# Patient Record
Sex: Female | Born: 1965 | Race: Black or African American | Hispanic: No | Smoking: Current every day smoker
Health system: Southern US, Community
[De-identification: ages and names within clinical notes are randomized; demographics above are authoritative.]

## PROBLEM LIST (undated history)

## (undated) DIAGNOSIS — J45909 Unspecified asthma, uncomplicated: Secondary | ICD-10-CM

## (undated) DIAGNOSIS — I10 Essential (primary) hypertension: Secondary | ICD-10-CM

## (undated) DIAGNOSIS — F1921 Other psychoactive substance dependence, in remission: Secondary | ICD-10-CM

## (undated) DIAGNOSIS — Z8619 Personal history of other infectious and parasitic diseases: Secondary | ICD-10-CM

## (undated) DIAGNOSIS — F32A Depression, unspecified: Secondary | ICD-10-CM

## (undated) DIAGNOSIS — F419 Anxiety disorder, unspecified: Secondary | ICD-10-CM

## (undated) DIAGNOSIS — T7840XA Allergy, unspecified, initial encounter: Secondary | ICD-10-CM

## (undated) DIAGNOSIS — R87629 Unspecified abnormal cytological findings in specimens from vagina: Secondary | ICD-10-CM

## (undated) DIAGNOSIS — D219 Benign neoplasm of connective and other soft tissue, unspecified: Secondary | ICD-10-CM

## (undated) DIAGNOSIS — G43909 Migraine, unspecified, not intractable, without status migrainosus: Secondary | ICD-10-CM

## (undated) DIAGNOSIS — E785 Hyperlipidemia, unspecified: Secondary | ICD-10-CM

## (undated) DIAGNOSIS — K219 Gastro-esophageal reflux disease without esophagitis: Secondary | ICD-10-CM

## (undated) HISTORY — DX: Depression, unspecified: F32.A

## (undated) HISTORY — DX: Migraine, unspecified, not intractable, without status migrainosus: G43.909

## (undated) HISTORY — DX: Other psychoactive substance dependence, in remission: F19.21

## (undated) HISTORY — DX: Unspecified abnormal cytological findings in specimens from vagina: R87.629

## (undated) HISTORY — DX: Gastro-esophageal reflux disease without esophagitis: K21.9

## (undated) HISTORY — DX: Personal history of other infectious and parasitic diseases: Z86.19

## (undated) HISTORY — DX: Unspecified asthma, uncomplicated: J45.909

## (undated) HISTORY — PX: APPENDECTOMY: SHX54

## (undated) HISTORY — DX: Benign neoplasm of connective and other soft tissue, unspecified: D21.9

## (undated) HISTORY — DX: Allergy, unspecified, initial encounter: T78.40XA

## (undated) HISTORY — DX: Anxiety disorder, unspecified: F41.9

## (undated) HISTORY — DX: Hyperlipidemia, unspecified: E78.5

---

## 1998-11-14 ENCOUNTER — Inpatient Hospital Stay (HOSPITAL_COMMUNITY): Admission: AD | Admit: 1998-11-14 | Discharge: 1998-11-18 | Payer: Self-pay | Admitting: *Deleted

## 1999-03-30 ENCOUNTER — Emergency Department (HOSPITAL_COMMUNITY): Admission: EM | Admit: 1999-03-30 | Discharge: 1999-03-30 | Payer: Self-pay | Admitting: Emergency Medicine

## 1999-03-30 ENCOUNTER — Encounter: Payer: Self-pay | Admitting: Emergency Medicine

## 2000-04-02 ENCOUNTER — Inpatient Hospital Stay (HOSPITAL_COMMUNITY): Admission: AD | Admit: 2000-04-02 | Discharge: 2000-04-02 | Payer: Self-pay | Admitting: *Deleted

## 2000-04-07 ENCOUNTER — Encounter (INDEPENDENT_AMBULATORY_CARE_PROVIDER_SITE_OTHER): Payer: Self-pay

## 2000-04-07 ENCOUNTER — Encounter: Payer: Self-pay | Admitting: *Deleted

## 2000-04-07 ENCOUNTER — Inpatient Hospital Stay (HOSPITAL_COMMUNITY): Admission: AD | Admit: 2000-04-07 | Discharge: 2000-04-07 | Payer: Self-pay | Admitting: *Deleted

## 2000-04-12 ENCOUNTER — Ambulatory Visit (HOSPITAL_COMMUNITY): Admission: RE | Admit: 2000-04-12 | Discharge: 2000-04-12 | Payer: Self-pay | Admitting: *Deleted

## 2000-04-12 ENCOUNTER — Encounter (INDEPENDENT_AMBULATORY_CARE_PROVIDER_SITE_OTHER): Payer: Self-pay | Admitting: Specialist

## 2000-12-06 ENCOUNTER — Encounter: Payer: Self-pay | Admitting: *Deleted

## 2000-12-06 ENCOUNTER — Ambulatory Visit (HOSPITAL_COMMUNITY): Admission: RE | Admit: 2000-12-06 | Discharge: 2000-12-06 | Payer: Self-pay | Admitting: *Deleted

## 2000-12-21 ENCOUNTER — Inpatient Hospital Stay (HOSPITAL_COMMUNITY): Admission: AD | Admit: 2000-12-21 | Discharge: 2000-12-21 | Payer: Self-pay | Admitting: *Deleted

## 2000-12-22 ENCOUNTER — Inpatient Hospital Stay (HOSPITAL_COMMUNITY): Admission: AD | Admit: 2000-12-22 | Discharge: 2000-12-25 | Payer: Self-pay | Admitting: *Deleted

## 2001-01-24 ENCOUNTER — Inpatient Hospital Stay (HOSPITAL_COMMUNITY): Admission: AD | Admit: 2001-01-24 | Discharge: 2001-01-24 | Payer: Self-pay | Admitting: *Deleted

## 2001-03-22 ENCOUNTER — Encounter: Payer: Self-pay | Admitting: *Deleted

## 2001-03-22 ENCOUNTER — Ambulatory Visit (HOSPITAL_COMMUNITY): Admission: RE | Admit: 2001-03-22 | Discharge: 2001-03-22 | Payer: Self-pay | Admitting: Obstetrics

## 2001-03-27 ENCOUNTER — Inpatient Hospital Stay (HOSPITAL_COMMUNITY): Admission: AD | Admit: 2001-03-27 | Discharge: 2001-03-30 | Payer: Self-pay | Admitting: *Deleted

## 2001-04-11 ENCOUNTER — Inpatient Hospital Stay (HOSPITAL_COMMUNITY): Admission: AD | Admit: 2001-04-11 | Discharge: 2001-04-13 | Payer: Self-pay | Admitting: *Deleted

## 2001-04-11 ENCOUNTER — Encounter (INDEPENDENT_AMBULATORY_CARE_PROVIDER_SITE_OTHER): Payer: Self-pay

## 2002-01-09 ENCOUNTER — Emergency Department (HOSPITAL_COMMUNITY): Admission: EM | Admit: 2002-01-09 | Discharge: 2002-01-09 | Payer: Self-pay | Admitting: Emergency Medicine

## 2003-01-01 ENCOUNTER — Emergency Department (HOSPITAL_COMMUNITY): Admission: EM | Admit: 2003-01-01 | Discharge: 2003-01-01 | Payer: Self-pay

## 2003-07-31 ENCOUNTER — Encounter: Payer: Self-pay | Admitting: Emergency Medicine

## 2003-07-31 ENCOUNTER — Emergency Department (HOSPITAL_COMMUNITY): Admission: EM | Admit: 2003-07-31 | Discharge: 2003-07-31 | Payer: Self-pay | Admitting: Emergency Medicine

## 2006-05-25 ENCOUNTER — Inpatient Hospital Stay (HOSPITAL_COMMUNITY): Admission: EM | Admit: 2006-05-25 | Discharge: 2006-05-29 | Payer: Self-pay | Admitting: *Deleted

## 2006-05-25 ENCOUNTER — Ambulatory Visit: Payer: Self-pay | Admitting: *Deleted

## 2007-03-05 ENCOUNTER — Emergency Department (HOSPITAL_COMMUNITY): Admission: EM | Admit: 2007-03-05 | Discharge: 2007-03-05 | Payer: Self-pay | Admitting: Emergency Medicine

## 2010-06-15 ENCOUNTER — Emergency Department (HOSPITAL_COMMUNITY): Admission: EM | Admit: 2010-06-15 | Discharge: 2010-06-15 | Payer: Self-pay | Admitting: Emergency Medicine

## 2010-06-24 ENCOUNTER — Emergency Department (HOSPITAL_COMMUNITY): Admission: EM | Admit: 2010-06-24 | Discharge: 2010-06-24 | Payer: Self-pay | Admitting: Emergency Medicine

## 2011-04-07 NOTE — Op Note (Signed)
[facility]  Patient:    Tracy Fischer, Tracy Fischer                   MRN: [id number] Proc. Date: 04/12/00 Adm. Date:  [id number] Disc. Date: [id number] Attending:  Deniece Ree                           Operative Report  PREOPERATIVE DIAGNOSIS:       Missed abortion.  POSTOPERATIVE DIAGNOSIS:      Missed abortion.  Plus pending pathology.  OPERATION:                    Dilatation and evacuation and curettage of the right uterus.  SURGEON:                      Deniece Ree, M.D.  ASSISTANT:  ANESTHESIA:                   MAC.  ESTIMATED BLOOD LOSS:         Approximately 100 cc.  COMPLICATIONS:                None.  CONDITION:                    The patient tolerated the procedure well and returned to the recovery room in satisfactory condition.  DESCRIPTION OF PROCEDURE:     The patient was taken to the operating room and prepped and draped in the usual fashion for dilatation and evacuation.  The speculum was placed in the right vagina at which time the right cervix was grasped with a Christella Hartigan tenaculum.  Paracervical block was then instituted using 1% Xylocaine solution.  The cervical os was dilated to a size 17 Pratt dilator.  A size 9 suction curet was introduced into the cervix in a routine fashion and evacuation was carried out utilizing the rotary type procedure.  After this was done, sharp curet was utilized for further evacuation of the uterus.  This was done and the  uterus was felt to be clean.  At this point, the left cervix and uterus was evaluated and again noted to be within normal limits.  At this point, the procedure was terminated.  The patient tolerated the procedure well and returned to the recovery room in satisfactory condition.  The patient is to be discharged when fully alert.  She has been instructed on the possible complications and care following this type of surgery.  She has been told to return to my office  in two weeks for follow-up evaluation or to call me prior to that time should any problems arise. DD:  04/12/00 TD:  04/15/00 Job: [id number]

## 2011-04-07 NOTE — Discharge Summary (Signed)
[facility]  Patient:    Tracy Fischer, Tracy Fischer                   MRN: [id number] Adm. Date:  [id number] Disc. Date: [id number] Attending:  Deniece Ree                           Discharge Summary  SUMMARY:                      The patient is a 45 year old gravida 8, para 1, abortus 7 status post a cesarean section with a history of a double uterus. Her estimated date of confinement at this time was April 25, 2001 making her approximately [redacted] weeks gestation.  The patient was admitted because of having premature contractions.  Patient was begun on a terbutaline pump which was carried until February 4 at which time she was started on terbutaline 5 mg p.o. q.6h.  After she had done well for approximately 24 hours patient was discharged home to be followed on a weekly basis in my office.  The patient was also instructed to call me prior to that time should any problems arise. DD:  01/30/01 TD:  01/30/01 Job: [id number]

## 2011-04-07 NOTE — Op Note (Signed)
[facility]  Patient:    Tracy Fischer, Tracy Fischer                   MRN: [id number] Proc. Date: 04/11/01 Adm. Date:  [id number] Attending:  Deniece Ree                           Operative Report  PREOPERATIVE DIAGNOSES:       Intrauterine pregnancy at approximately [redacted] weeks gestation, multiparity, desire for permanent sterilization.  OPERATION:                    Repeat cesarean section, bilateral tubal ligation using the modified Pomeroy procedure.  POSTOPERATIVE DIAGNOSES:      Viable female infant with Apgars of 9 and 9.  SURGEON:                      Deniece Ree, M.D.  ASSISTANT:                    Kathreen Cosier, M.D.  ANESTHESIA:                   Epidural by Dr. Jean Rosenthal.  PEDIATRICS:                   Dr. Alison Murray in the teaching service.  ESTIMATED BLOOD LOSS:         300 cc.  COMPLICATIONS:                None.  DRAINS:                       Foley left to straight drainage.                                Patient tolerated the procedure well and returned to the recovery room in satisfactory condition.  PROCEDURE:                    The patient was taken to the operating room, prepped and draped in the usual fashion for a repeat cesarean section.  A low Pfannenstiel incision was made following the course of the previous incision. This was carried down to the fascia at which time the fascia was entered and excised the extent of the incision.  The midline identified and rectus muscles separated.  The abdominal peritoneum was then entered in the vertical fashion using the Metzenbaum scissors.  The visceroperitoneum was then excised bilaterally following which the lower uterine segment was scored, entered in the midline, and bluntly dissected open.  With some difficulty with the use of a Mityvac extraction, the infants head was delivered.  The nasopharynx was then sucked out with a suction bulb.  Complete delivery was then carried  out without any problems.  Cord was clamped, cut, and infant turned over to the pediatricians who were in attendance.  Cord blood at that point was obtained. The placenta as well as all products of conception were then manually removed from the uterine cavity.  IV Pitocin as well as IV antibiotics were then begun.  The myometrium was then closed using #1 Chromic in a running stitch followed by closure of the visceroperitoneum and an embrocating stitch using 0 Chromic in a running stitch.  The evaluation of the uterus revealed the right uterus  to be the one that the pregnancy was in and had a normal post cesarean section postpartum size present.  The right tube was then grasped with Babcock clamp and ligated using 0 plain catgut.  The left uterus was small and very low appearing as compared to the right uterus.  However, the left tube was then grasped, knuckled up and using 0 plain catgut ligated utilizing the modified Pomeroy procedure.  Both tubal segments were then labeled and sent to pathology.  At this point hemostasis was present.  Sponge, needle count was correct x 2.  The abdominal peritoneum was then closed using 2-0 Chromic in a running stitch followed by closure of the fascia using #1 Dexon in a running stitch.  The subcuticular stitch using 4-0 Monocryl was used to close the skin.  The procedure was then terminated.  Patient tolerated the procedure well and returned to the recovery room in satisfactory condition. DD:  04/11/01 TD:  04/11/01 Job: [id number]

## 2011-04-07 NOTE — Discharge Summary (Signed)
[facility]  Patient:    Tracy Fischer, Tracy Fischer                   MRN: [id number] Adm. Date:  [id number] Disc. Date: [id number] Attending:  Deniece Ree                           Discharge Summary  SUMMARY:                      The patient is a 45 year old gravida 7, para 1, abortus 5, who is approximately [redacted] weeks gestation. The patient is being admitted for a repeat cesarean section and a bilateral tubal ligation. The patients previous delivery was by way of cesarean section. The patient has utilized terbutaline throughout the third trimester. The patients history includes that of having a double uterus, double vagina, and double cervix. The patient desires a permanent sterilization and therefore, a repeat cesarean section with bilateral tubal ligation is planned.  On May 23 the patient underwent a repeat cesarean section and a bilateral tubal ligation from which she tolerated the procedure very well without any problem. Postoperatively, the patient did very well without any complications, and was discharged on the third postoperative day.  DISCHARGE INSTRUCTIONS:       She was instructed on the possible complications and care following this type of surgery.  DISCHARGE FOLLOWUP:           She was told to return to my office in four weeks for followup evaluation or to call me prior to that time should any problems arise. DD:  04/27/01 TD:  04/28/01 Job: [id number]

## 2011-04-07 NOTE — H&P (Signed)
[facility]  Patient:    Tracy Fischer, Tracy Fischer                  MRN: [id number] Adm. Date:  04/11/01 Attending:  Deniece Ree, M.D.                         History and Physical  HISTORY:                      The patient is a 45 year old gravida 7, para 1, abortus 5 who is approximately [redacted] weeks gestation.  Patient is being admitted for repeat cesarean section and a bilateral tubal ligation.  The patients previous delivery was by way of cesarean section.  Patient has also been utilizing terbutaline during the third trimester.  Patients history is significant in that patient has a double uterus, double vagina, double cervix. This pregnancy is in the right uterus.  Patients only prenatal complication was premature labor which also occurred during the last pregnancy.  Patient has had five spontaneous miscarriages.  This patient desires permanent sterilization.  She understands the different types of contraception available, however, does not wish any further pregnancies.  PAST MEDICAL HISTORY:         Noncontributory except as stated in the present illness.  FAMILY HISTORY:               Noncontributory except as stated in the present illness.  REVIEW OF SYSTEMS:            Noncontributory except as stated in the present illness.  PHYSICAL EXAMINATION  GENERAL:                      Well-developed, well-nourished gravid female in no acute distress.  HEENT:                        Within normal limits.  NECK:                         Supple.  BREASTS:                      Without masses, tenderness, or discharge.  LUNGS:                        Clear to percussion, auscultation.  HEART:                        Normal sinus rhythm without murmur, rub, or gallop.  ABDOMEN:                      Approximately [redacted] weeks gestational size with good fetal heart tones in the left lower quadrant.  EXTREMITIES:                  Within normal  limits.  NEUROLOGIC:                   Within normal limits.  PELVIC:                       External genitalia, BUS:  Within normal limits. The vagina on the right is normal appearing.  Cervix is closed; however, the lower uterine segment is developing.  The uterus appears to be approximately [redacted] weeks  gestational size.  The vagina on the left is normal appearing with a normal appearing cervix.  The uterus is small and normal size.  The uteri of the left and right are totally separated and it is already known from previous cesarean section that patient only has a tube and ovary on the left and one tube and ovary on the right.  DIAGNOSES:                    Intrauterine pregnancy approximately [redacted] weeks gestation, multiparity, desires permanent sterilization.  PLAN:                         Repeat cesarean section and a bilateral tubal ligation. DD:  04/11/01 TD:  04/11/01 Job: [id number]

## 2011-04-07 NOTE — H&P (Signed)
[facility]  Patient:    Tracy Fischer, Tracy Fischer                   MRN: [id number] Adm. Date:  [id number] Disc. Date: [id number] Attending:  Deniece Ree                         History and Physical  HISTORY:  The patient is a 45 year old female who has a missed abortion.  The patient was seen on Apr 07, 2000 because of vaginal bleeding and some pelvic cramping.  On evaluation, it was noted that the patient probably had a spontaneous abortion, possible debris present.  On evaluation at that time, it was felt that the patient probably had a complete spontaneous abortion.  The patient does have two uteruses and this was felt to be in the right uterus.  This was verified by the ultrasound.  The patient did fine until Apr 11, 2000.  In the evening, the patient began passing what appeared to be products of conception, having a moderate amount of cramping, and was reevaluated and felt that she had had a missed abortion, therefore would be scheduled for dilatation and evaluation.  This information was given to the patient, who understood.  She also understood the possible complications from dilatation and evaluation.  PAST MEDICAL HISTORY:  The patient has had a previous delivery via C-section in her right uterus.  REVIEW OF SYSTEMS:  Noncontributory except as stated in the present illness.  PHYSICAL EXAMINATION:  A well-developed, well-nourished female in no acute distress.  HEENT:  Within normal limits.  NECK:  Supple.  BREASTS:  Without masses, tenderness, or discharge.  LUNGS:  Clear to percussion and auscultation.  HEART:  Normal sinus rhythm without murmurs, rubs, or gallops.  ABDOMEN:  Benign.  EXTREMITIES:  Within normal limits.  NEUROLOGIC:  Within normal limits.  PELVIC:  Examination revealed external genitalia ______ to be within normal limits. The right cervix showed products of conception protruding through the cervical s. The  uterus on the right appeared to be approximately 10 weeks in size.  The adnexa was benign.  The right cervix and vagina were all clear with normal size and shape. The uterus was normal size, shape, and consistency.  No adnexal problems.  IMPRESSION:  The diagnosis at this time is missed abortion of the right uterus.   PLAN:  The plan is for dilatation and evaluation. DD:  04/12/00 TD:  04/15/00 Job: [id number]
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
## 2011-04-07 NOTE — Discharge Summary (Signed)
[facility]  Patient:    Tracy Fischer, Tracy Fischer                   MRN: [id number] Adm. Date:  [id number] Disc. Date: [id number] Attending:  Deniece Ree Dictator:   Kathreen Cosier, M.D.                           Discharge Summary  SUMMARY:                      The patient is a 45 year old, gravida 7, para 1-0-5-1 at [redacted] weeks gestation admitted in premature labor. It was reported in the chart that she was having contractions that every three to four minutes, cervix 2-3 cm, 90%. She was started on magnesium sulfate and magnesium sulfate was discontinued on May 10. She was observed until May 11 and she had no more contractions and was discharged home by Dr. Wiliam Ke.  DISCHARGE DIAGNOSIS:          Intrauterine pregnancy at 36 weeks with premature contractions which ceased with magnesium sulfate. DD:  07/04/01 TD:  07/04/01 Job: [id number]

## 2011-12-28 ENCOUNTER — Encounter (INDEPENDENT_AMBULATORY_CARE_PROVIDER_SITE_OTHER): Payer: Self-pay

## 2015-06-13 ENCOUNTER — Emergency Department (HOSPITAL_COMMUNITY): Payer: Self-pay

## 2015-06-13 ENCOUNTER — Emergency Department (HOSPITAL_COMMUNITY)
Admission: EM | Admit: 2015-06-13 | Discharge: 2015-06-14 | Disposition: A | Discharge status: Home / Self Care | Payer: Self-pay | Attending: Emergency Medicine | Admitting: Emergency Medicine

## 2015-06-13 ENCOUNTER — Encounter (HOSPITAL_COMMUNITY): Payer: Self-pay

## 2015-06-13 DIAGNOSIS — R079 Chest pain, unspecified: Secondary | ICD-10-CM | POA: Insufficient documentation

## 2015-06-13 DIAGNOSIS — Z9889 Other specified postprocedural states: Secondary | ICD-10-CM | POA: Insufficient documentation

## 2015-06-13 DIAGNOSIS — Z720 Tobacco use: Secondary | ICD-10-CM | POA: Insufficient documentation

## 2015-06-13 DIAGNOSIS — R109 Unspecified abdominal pain: Secondary | ICD-10-CM

## 2015-06-13 DIAGNOSIS — R110 Nausea: Secondary | ICD-10-CM | POA: Insufficient documentation

## 2015-06-13 DIAGNOSIS — R1013 Epigastric pain: Secondary | ICD-10-CM | POA: Insufficient documentation

## 2015-06-13 LAB — BASIC METABOLIC PANEL
ANION GAP: 4 — AB (ref 5–15)
BUN: 10 mg/dL (ref 6–20)
CHLORIDE: 108 mmol/L (ref 101–111)
CO2: 24 mmol/L (ref 22–32)
Calcium: 8.7 mg/dL — ABNORMAL LOW (ref 8.9–10.3)
Creatinine, Ser: 0.75 mg/dL (ref 0.44–1.00)
Glucose, Bld: 106 mg/dL — ABNORMAL HIGH (ref 65–99)
Potassium: 3.5 mmol/L (ref 3.5–5.1)
Sodium: 136 mmol/L (ref 135–145)

## 2015-06-13 LAB — CBC
HCT: 29 % — ABNORMAL LOW (ref 36.0–46.0)
Hemoglobin: 8.6 g/dL — ABNORMAL LOW (ref 12.0–15.0)
MCH: 21 pg — ABNORMAL LOW (ref 26.0–34.0)
MCHC: 29.7 g/dL — ABNORMAL LOW (ref 30.0–36.0)
MCV: 70.9 fL — AB (ref 78.0–100.0)
Platelets: 418 10*3/uL — ABNORMAL HIGH (ref 150–400)
RBC: 4.09 MIL/uL (ref 3.87–5.11)
RDW: 16.6 % — ABNORMAL HIGH (ref 11.5–15.5)
WBC: 8.6 10*3/uL (ref 4.0–10.5)

## 2015-06-13 LAB — I-STAT TROPONIN, ED: Troponin i, poc: 0.01 ng/mL (ref 0.00–0.08)

## 2015-06-13 NOTE — ED Notes (Signed)
Pt presents with c/o chest pain and abdominal pain. Pt reports the chest pain is in the left part of her chest and started today. Pt reports the abdominal pain started about 2 weeks. Pt reports some nausea but no vomiting with the pain.

## 2015-06-13 NOTE — ED Provider Notes (Signed)
CSN: [id number]     Arrival date & time 06/13/15  2138 History   First MD Initiated Contact with Patient 06/13/15 2228     Chief Complaint  Patient presents with  . Chest Pain  . Abdominal Pain     (Consider location/radiation/quality/duration/timing/severity/associated sxs/prior Treatment) HPI Tracy Fischer is a 49 y.o. female who comes in for evaluation of chest pain and abdominal pain. Patient reports she has had cramping abdominal pain for the past 2 weeks intermittently with associated nausea but no vomiting. She has tried [employer] and Pepto-Bismol with some relief of her symptoms. Patient also reports drinking "a 40 every other day". She also smokes 1-2 packs of cigarettes per day. She also reports intermittent chest pains today that lasted a few minutes and dissipated. Chest discomfort described as "pinching sensation under my left breast." This discomfort is exacerbated with coughing and is not exertional. She does report not drinking water very often, but "eating a lot of ice". Denies fevers, chills, numbness or weakness or other medical problems.   History reviewed. No pertinent past medical history. Past Surgical History  Procedure Laterality Date  . Cesarean section     No family history on file. History  Substance Use Topics  . Smoking status: Current Every Day Smoker  . Smokeless tobacco: Not on file  . Alcohol Use: Yes     Comment: weekly    OB History    No data available     Review of Systems A 10 point review of systems was completed and was negative except for pertinent positives and negatives as mentioned in the history of present illness     Allergies  Review of patient's allergies indicates no known allergies.  Home Medications   Prior to Admission medications   Medication Sig Start Date End Date Taking? Authorizing Provider  bismuth subsalicylate (PEPTO BISMOL) 262 MG/15ML suspension Take 30 mLs by mouth every 6 (six) hours as needed. For upset  stomach   Yes Historical Provider, MD  ibuprofen (ADVIL,MOTRIN) 200 MG tablet Take 600 mg by mouth every 6 (six) hours as needed for mild pain.    Yes Historical Provider, MD  ferrous sulfate 325 (65 FE) MG tablet Take 1 tablet (325 mg total) by mouth daily. 06/14/15   Comer Locket, PA-C  omeprazole (PRILOSEC) 20 MG capsule Take 1 capsule (20 mg total) by mouth daily. 06/14/15   Comer Locket, PA-C   BP 167/92 mmHg  Pulse 86  Temp(Src) 97.8 F (36.6 C) (Oral)  Resp 18  SpO2 100%  LMP 04/21/2015 (Approximate) Physical Exam  Constitutional: She is oriented to person, place, and time. She appears well-developed and well-nourished.  HENT:  Head: Normocephalic and atraumatic.  Mouth/Throat: Oropharynx is clear and moist.  Pale conjunctiva  Eyes: Conjunctivae are normal. Pupils are equal, round, and reactive to light. Right eye exhibits no discharge. Left eye exhibits no discharge. No scleral icterus.  Neck: Neck supple.  Cardiovascular: Normal rate, regular rhythm and normal heart sounds.   Pulmonary/Chest: Effort normal and breath sounds normal. No respiratory distress. She has no wheezes. She has no rales.  Abdominal: Soft. There is no tenderness.  Epigastric discomfort with some guarding. Abdomen is otherwise soft and nondistended. No peritoneal signs.  Musculoskeletal: She exhibits no tenderness.  Neurological: She is alert and oriented to person, place, and time.  Cranial Nerves II-XII grossly intact  Skin: Skin is warm and dry. No rash noted.  Psychiatric: She has a normal mood and affect.  Nursing note and vitals reviewed.   ED Course  Procedures (including critical care time) Labs Review Labs Reviewed  BASIC METABOLIC PANEL - Abnormal; Notable for the following:    Glucose, Bld 106 (*)    Calcium 8.7 (*)    Anion gap 4 (*)    All other components within normal limits  CBC - Abnormal; Notable for the following:    Hemoglobin 8.6 (*)    HCT 29.0 (*)    MCV 70.9 (*)     MCH 21.0 (*)    MCHC 29.7 (*)    RDW 16.6 (*)    Platelets 418 (*)    All other components within normal limits  HEPATIC FUNCTION PANEL - Abnormal; Notable for the following:    Total Bilirubin 0.2 (*)    Bilirubin, Direct <0.1 (*)    All other components within normal limits  LIPASE, BLOOD  I-STAT TROPOININ, ED    Imaging Review Dg Chest 2 View  06/13/2015   CLINICAL DATA:  Left lower chest pain today, onset yesterday. Dyspnea.  EXAM: CHEST  2 VIEW  COMPARISON:  None.  FINDINGS: There is mild hyperinflation. The lungs are clear. The pulmonary vasculature is normal. There are no pleural effusions. Hilar, mediastinal and cardiac contours are unremarkable. Heart size is normal.  IMPRESSION: Mild hyperinflation.   Electronically Signed   By: Andreas Newport M.D.   On: 06/13/2015 22:38     EKG Interpretation   Date/Time:  Sunday June 13 2015 21:53:49 EDT Ventricular Rate:  87 PR Interval:  153 QRS Duration: 70 QT Interval:  372 QTC Calculation: 447 R Axis:   66 Text Interpretation:  Sinus rhythm Probable left atrial enlargement  Probable left ventricular hypertrophy Baseline wander in lead(s) V5 ED  PHYSICIAN INTERPRETATION AVAILABLE IN [facility] Confirmed by TEST,  Record ([id number]) on 06/14/2015 6:56:47 AM     Meds given in ED:  Medications  gi cocktail (Maalox,Lidocaine,Donnatal) (30 mLs Oral Given 06/14/15 0028)    Discharge Medication List as of 06/14/2015 12:34 AM    START taking these medications   Details  ferrous sulfate 325 (65 FE) MG tablet Take 1 tablet (325 mg total) by mouth daily., Starting 06/14/2015, Until Discontinued, Print    omeprazole (PRILOSEC) 20 MG capsule Take 1 capsule (20 mg total) by mouth daily., Starting 06/14/2015, Until Discontinued, Print       Filed Vitals:   06/13/15 2150 06/13/15 2300 06/14/15 0017  BP: 177/96 158/93 167/92  Pulse: 94 76 86  Temp: 97.6 F (36.4 C)  97.8 F (36.6 C)  TempSrc: Oral  Oral  Resp: 22 17 18    SpO2: 100% 100% 100%    MDM  Vitals stable -afebrile Pt resting comfortably in ED. PE--moderate epigastric ttp, Normal Cardiac and Lung exams. Physical exam otherwise benign Labwork--Microcytic anemia 8.6 hgb, troponin neg, lipase 39, labs otherwise noncontributory EKG reassuring Imaging--CXR shows no acute cardiopulmonary pathology  DDX- Epigastric discomfort likely 2/2 alcoholic gastritis, counseled on etoh cessation and initiate ppi. Chest discomfort atypical and not consistent with ACS. Doubt PE, PERC neg. No evidence of respiratory distress on exam, doubt acute CHF Will DC with Fe supp, PPI and referral to [facility] to establish PCP care. I discussed all relevant lab findings and imaging results with pt and they verbalized understanding. I personally reviewed the imaging and agree with the results as interpreted by the radiologist.  Discussed f/u with PCP within 48 hrs and return precautions, pt very amenable to  plan. Prior to patient discharge, I discussed and reviewed this case with Dr.Docherty   Final diagnoses:  Abdominal discomfort        Comer Locket, PA-C 06/14/15 [place], MD 06/15/15 [phone]

## 2015-06-14 LAB — HEPATIC FUNCTION PANEL
ALBUMIN: 3.8 g/dL (ref 3.5–5.0)
ALT: 22 U/L (ref 14–54)
AST: 27 U/L (ref 15–41)
Alkaline Phosphatase: 50 U/L (ref 38–126)
Total Bilirubin: 0.2 mg/dL — ABNORMAL LOW (ref 0.3–1.2)
Total Protein: 6.8 g/dL (ref 6.5–8.1)

## 2015-06-14 LAB — LIPASE, BLOOD: LIPASE: 39 U/L (ref 22–51)

## 2015-06-14 MED ORDER — FERROUS SULFATE 325 (65 FE) MG PO TABS: 325.0000 mg | ORAL_TABLET | Freq: Every day | ORAL | Status: DC

## 2015-06-14 MED ORDER — GI COCKTAIL ~~LOC~~
30.0000 mL | Freq: Once | ORAL | Status: AC
  Administered 2015-06-14: 30 mL via ORAL
  Filled 2015-06-14: qty 30

## 2015-06-14 MED ORDER — OMEPRAZOLE 20 MG PO CPDR: 20.0000 mg | DELAYED_RELEASE_CAPSULE | Freq: Every day | ORAL | Status: DC

## 2015-06-14 NOTE — Discharge Instructions (Signed)
You were evaluated in the ED today for your abdominal discomfort. There does not appear to be an emergent cause for your symptoms at this time. Restart taking your antiacid acid to help with your discomfort. It is also important to start taking her iron pills to help with your anemia. Please follow-up with primary care for further evaluation and management of your symptoms. Return to ED for worsening symptoms.  Abdominal Pain Many things can cause abdominal pain. Usually, abdominal pain is not caused by a disease and will improve without treatment. It can often be observed and treated at home. Your health care provider will do a physical exam and possibly order blood tests and X-rays to help determine the seriousness of your pain. However, in many cases, more time must pass before a clear cause of the pain can be found. Before that point, your health care provider may not know if you need more testing or further treatment. HOME CARE INSTRUCTIONS  Monitor your abdominal pain for any changes. The following actions may help to alleviate any discomfort you are experiencing:  Only take over-the-counter or prescription medicines as directed by your health care provider.  Do not take laxatives unless directed to do so by your health care provider.  Try a clear liquid diet (broth, tea, or water) as directed by your health care provider. Slowly move to a bland diet as tolerated. SEEK MEDICAL CARE IF:  You have unexplained abdominal pain.  You have abdominal pain associated with nausea or diarrhea.  You have pain when you urinate or have a bowel movement.  You experience abdominal pain that wakes you in the night.  You have abdominal pain that is worsened or improved by eating food.  You have abdominal pain that is worsened with eating fatty foods.  You have a fever. SEEK IMMEDIATE MEDICAL CARE IF:   Your pain does not go away within 2 hours.  You keep throwing up (vomiting).  Your pain is  felt only in portions of the abdomen, such as the right side or the left lower portion of the abdomen.  You pass bloody or black tarry stools. MAKE SURE YOU:  Understand these instructions.   Will watch your condition.   Will get help right away if you are not doing well or get worse.  Document Released: 08/16/2005 Document Revised: 11/11/2013 Document Reviewed: 07/16/2013 [facility] Patient Information 2015 [place], [place]. This information is not intended to replace advice given to you by your health care provider. Make sure you discuss any questions you have with your health care provider.

## 2019-09-19 ENCOUNTER — Other Ambulatory Visit: Payer: Self-pay

## 2019-09-19 DIAGNOSIS — Z20822 Contact with and (suspected) exposure to covid-19: Secondary | ICD-10-CM

## 2019-09-21 LAB — NOVEL CORONAVIRUS, NAA: SARS-CoV-2, NAA: NOT DETECTED

## 2021-03-16 ENCOUNTER — Emergency Department (HOSPITAL_COMMUNITY): Payer: Self-pay

## 2021-03-16 ENCOUNTER — Other Ambulatory Visit: Payer: Self-pay

## 2021-03-16 ENCOUNTER — Inpatient Hospital Stay (HOSPITAL_COMMUNITY)
Admission: EM | Admit: 2021-03-16 | Discharge: 2021-03-24 | DRG: 336 | Disposition: A | Discharge status: Home / Self Care | Payer: Self-pay | Attending: Surgery | Admitting: Surgery

## 2021-03-16 ENCOUNTER — Encounter (HOSPITAL_COMMUNITY): Payer: Self-pay

## 2021-03-16 DIAGNOSIS — Z79899 Other long term (current) drug therapy: Secondary | ICD-10-CM

## 2021-03-16 DIAGNOSIS — F17200 Nicotine dependence, unspecified, uncomplicated: Secondary | ICD-10-CM | POA: Diagnosis present

## 2021-03-16 DIAGNOSIS — Z20822 Contact with and (suspected) exposure to covid-19: Secondary | ICD-10-CM | POA: Diagnosis present

## 2021-03-16 DIAGNOSIS — K660 Peritoneal adhesions (postprocedural) (postinfection): Secondary | ICD-10-CM | POA: Diagnosis present

## 2021-03-16 DIAGNOSIS — I160 Hypertensive urgency: Secondary | ICD-10-CM | POA: Diagnosis present

## 2021-03-16 DIAGNOSIS — Z9114 Patient's other noncompliance with medication regimen: Secondary | ICD-10-CM

## 2021-03-16 DIAGNOSIS — F1010 Alcohol abuse, uncomplicated: Secondary | ICD-10-CM | POA: Diagnosis present

## 2021-03-16 DIAGNOSIS — Z833 Family history of diabetes mellitus: Secondary | ICD-10-CM

## 2021-03-16 DIAGNOSIS — I10 Essential (primary) hypertension: Secondary | ICD-10-CM | POA: Diagnosis present

## 2021-03-16 DIAGNOSIS — Z7982 Long term (current) use of aspirin: Secondary | ICD-10-CM

## 2021-03-16 DIAGNOSIS — Z8249 Family history of ischemic heart disease and other diseases of the circulatory system: Secondary | ICD-10-CM

## 2021-03-16 DIAGNOSIS — K567 Ileus, unspecified: Secondary | ICD-10-CM | POA: Diagnosis not present

## 2021-03-16 DIAGNOSIS — K3532 Acute appendicitis with perforation and localized peritonitis, without abscess: Principal | ICD-10-CM | POA: Diagnosis present

## 2021-03-16 DIAGNOSIS — K3580 Unspecified acute appendicitis: Secondary | ICD-10-CM | POA: Diagnosis present

## 2021-03-16 DIAGNOSIS — F1410 Cocaine abuse, uncomplicated: Secondary | ICD-10-CM | POA: Diagnosis present

## 2021-03-16 HISTORY — DX: Essential (primary) hypertension: I10

## 2021-03-16 LAB — RESP PANEL BY RT-PCR (FLU A&B, COVID) ARPGX2
Influenza A by PCR: NEGATIVE
Influenza B by PCR: NEGATIVE
SARS Coronavirus 2 by RT PCR: NEGATIVE

## 2021-03-16 LAB — CBC WITH DIFFERENTIAL/PLATELET
Abs Immature Granulocytes: 0.06 10*3/uL (ref 0.00–0.07)
Basophils Absolute: 0 10*3/uL (ref 0.0–0.1)
Basophils Relative: 0 %
Eosinophils Absolute: 0 10*3/uL (ref 0.0–0.5)
Eosinophils Relative: 0 %
HCT: 42.2 % (ref 36.0–46.0)
Hemoglobin: 14.4 g/dL (ref 12.0–15.0)
Immature Granulocytes: 0 %
Lymphocytes Relative: 9 %
Lymphs Abs: 1.3 10*3/uL (ref 0.7–4.0)
MCH: 32.1 pg (ref 26.0–34.0)
MCHC: 34.1 g/dL (ref 30.0–36.0)
MCV: 94 fL (ref 80.0–100.0)
Monocytes Absolute: 1.1 10*3/uL — ABNORMAL HIGH (ref 0.1–1.0)
Monocytes Relative: 7 %
Neutro Abs: 12.1 10*3/uL — ABNORMAL HIGH (ref 1.7–7.7)
Neutrophils Relative %: 84 %
Platelets: 259 10*3/uL (ref 150–400)
RBC: 4.49 MIL/uL (ref 3.87–5.11)
RDW: 12.4 % (ref 11.5–15.5)
WBC: 14.6 10*3/uL — ABNORMAL HIGH (ref 4.0–10.5)
nRBC: 0 % (ref 0.0–0.2)

## 2021-03-16 LAB — COMPREHENSIVE METABOLIC PANEL
ALT: 22 U/L (ref 0–44)
AST: 22 U/L (ref 15–41)
Albumin: 4.4 g/dL (ref 3.5–5.0)
Alkaline Phosphatase: 82 U/L (ref 38–126)
Anion gap: 14 (ref 5–15)
BUN: 11 mg/dL (ref 6–20)
CO2: 24 mmol/L (ref 22–32)
Calcium: 9.8 mg/dL (ref 8.9–10.3)
Chloride: 104 mmol/L (ref 98–111)
Creatinine, Ser: 0.98 mg/dL (ref 0.44–1.00)
GFR, Estimated: >60 mL/min (ref >60)
Glucose, Bld: 116 mg/dL — ABNORMAL HIGH (ref 70–99)
Potassium: 3.8 mmol/L (ref 3.5–5.1)
Sodium: 142 mmol/L (ref 135–145)
Total Bilirubin: 0.9 mg/dL (ref 0.3–1.2)
Total Protein: 7.8 g/dL (ref 6.5–8.1)

## 2021-03-16 LAB — LIPASE, BLOOD: Lipase: 38 U/L (ref 11–51)

## 2021-03-16 MED ORDER — MORPHINE SULFATE (PF) 2 MG/ML IV SOLN
1.0000 mg | INTRAVENOUS | Status: DC | PRN
  Administered 2021-03-17 (×4): 1 mg via INTRAVENOUS
  Filled 2021-03-16 (×4): qty 1

## 2021-03-16 MED ORDER — THIAMINE HCL 100 MG/ML IJ SOLN
100.0000 mg | Freq: Every day | INTRAMUSCULAR | Status: DC
  Administered 2021-03-17 (×2): 100 mg via INTRAVENOUS
  Filled 2021-03-16 (×2): qty 2

## 2021-03-16 MED ORDER — SODIUM CHLORIDE 0.9 % IV SOLN
2.0000 g | INTRAVENOUS | Status: DC
  Filled 2021-03-16: qty 20

## 2021-03-16 MED ORDER — SODIUM CHLORIDE 0.9 % IV BOLUS
1000.0000 mL | Freq: Once | INTRAVENOUS | Status: AC
  Administered 2021-03-16: 1000 mL via INTRAVENOUS

## 2021-03-16 MED ORDER — ONDANSETRON HCL 4 MG/2ML IJ SOLN
4.0000 mg | Freq: Once | INTRAMUSCULAR | Status: AC
  Administered 2021-03-16: 4 mg via INTRAVENOUS
  Filled 2021-03-16: qty 2

## 2021-03-16 MED ORDER — FLEET ENEMA 7-19 GM/118ML RE ENEM
1.0000 | ENEMA | Freq: Once | RECTAL | Status: AC
  Administered 2021-03-16: 1 via RECTAL
  Filled 2021-03-16: qty 1

## 2021-03-16 MED ORDER — LACTULOSE 10 GM/15ML PO SOLN
30.0000 g | Freq: Once | ORAL | Status: AC
  Administered 2021-03-16: 30 g via ORAL
  Filled 2021-03-16: qty 60

## 2021-03-16 MED ORDER — METRONIDAZOLE IN NACL 5-0.79 MG/ML-% IV SOLN
500.0000 mg | Freq: Three times a day (TID) | INTRAVENOUS | Status: DC
  Administered 2021-03-17: 500 mg via INTRAVENOUS
  Filled 2021-03-16 (×2): qty 100

## 2021-03-16 MED ORDER — HYDROMORPHONE HCL 1 MG/ML IJ SOLN
1.0000 mg | Freq: Once | INTRAMUSCULAR | Status: AC
  Administered 2021-03-16: 1 mg via INTRAVENOUS
  Filled 2021-03-16: qty 1

## 2021-03-16 MED ORDER — HYDRALAZINE HCL 20 MG/ML IJ SOLN
10.0000 mg | INTRAMUSCULAR | Status: DC | PRN
  Administered 2021-03-17: 10 mg via INTRAVENOUS
  Filled 2021-03-16: qty 1

## 2021-03-16 MED ORDER — MORPHINE SULFATE (PF) 4 MG/ML IV SOLN
4.0000 mg | Freq: Once | INTRAVENOUS | Status: AC
  Administered 2021-03-16: 4 mg via INTRAVENOUS
  Filled 2021-03-16: qty 1

## 2021-03-16 MED ORDER — DEXTROSE-NACL 5-0.9 % IV SOLN: INTRAVENOUS | Status: DC

## 2021-03-16 MED ORDER — SODIUM CHLORIDE 0.9 % IV SOLN
2.0000 g | Freq: Once | INTRAVENOUS | Status: AC
  Administered 2021-03-16: 2 g via INTRAVENOUS
  Filled 2021-03-16: qty 20

## 2021-03-16 MED ORDER — IOHEXOL 300 MG/ML  SOLN
100.0000 mL | Freq: Once | INTRAMUSCULAR | Status: AC | PRN
  Administered 2021-03-16: 100 mL via INTRAVENOUS

## 2021-03-16 MED ORDER — METRONIDAZOLE IN NACL 5-0.79 MG/ML-% IV SOLN
500.0000 mg | Freq: Once | INTRAVENOUS | Status: AC
  Administered 2021-03-17: 500 mg via INTRAVENOUS
  Filled 2021-03-16: qty 100

## 2021-03-16 MED ORDER — HYDRALAZINE HCL 20 MG/ML IJ SOLN
10.0000 mg | Freq: Once | INTRAMUSCULAR | Status: AC
  Administered 2021-03-16: 10 mg via INTRAVENOUS
  Filled 2021-03-16: qty 1

## 2021-03-16 NOTE — H&P (Signed)
History and Physical    Tracy Fischer KKX:[id number] DOB: 1966-05-11 DOA: 03/16/2021  PCP: Pcp, No  Patient coming from: Home.  Chief Complaint: Abdominal pain.  HPI: Tracy Fischer is a 55 y.o. female with history of hypertension who has not been taking her medication for many months presents to the ER with complaints of abdominal pain.  Abdominal pain has been present for the last 3 days which has been gradually worsening.  Pain is mostly in the lower quadrants.  Had 1 episode of nausea vomiting before coming to the ER.  Has not moved bowels for last 3 days.  Patient admits to drinking wine every other day and using cocaine.  ED Course: In the ER patient blood pressure was found to be more than 169 systolic with diastolic more than 678.  Blood work shows mild leukocytosis.  CT abdomen pelvis shows acute appendicitis.  On-call general surgeon Dr. Reeves Forth was consulted.  Requested hospitalist admission and patient was started on IV antibiotics and as needed IV labetalol for blood pressure control.  EKG shows normal sinus rhythm with nonspecific ST-T changes.  And COVID test was negative.  Review of Systems: As per HPI, rest all negative.   Past Medical History:  Diagnosis Date  . Hypertension     Past Surgical History:  Procedure Laterality Date  . CESAREAN SECTION    . CESAREAN SECTION       reports that she has been smoking. She has never used smokeless tobacco. She reports current alcohol use. She reports current drug use. Drugs: Cocaine and Marijuana.  No Known Allergies  Family History  Problem Relation Age of Onset  . Diabetes Mellitus II Father   . Hypertension Father     Prior to Admission medications   Medication Sig Start Date End Date Taking? Authorizing Provider  acetaminophen (TYLENOL) 500 MG tablet Take 1,000 mg by mouth every 6 (six) hours as needed for mild pain.   Yes [provider]  aspirin 325 MG tablet Take 325 mg by mouth every 4  (four) hours as needed for moderate pain.   Yes [provider]  aspirin-sod bicarb-citric acid (ALKA-SELTZER) 325 MG TBEF tablet Take 325 mg by mouth every 6 (six) hours as needed (pain).   Yes [provider]  bismuth subsalicylate (PEPTO BISMOL) 262 MG/15ML suspension Take 30 mLs by mouth every 6 (six) hours as needed. For upset stomach   Yes [provider]  ibuprofen (ADVIL,MOTRIN) 200 MG tablet Take 600 mg by mouth every 6 (six) hours as needed for mild pain.    Yes [provider]  magnesium hydroxide (MILK OF MAGNESIA) 400 MG/5ML suspension Take 30 mLs by mouth daily as needed for mild constipation.   Yes [provider]  ranitidine (ZANTAC) 75 MG tablet Take 75 mg by mouth 2 (two) times daily as needed for heartburn.   Yes [provider]    Physical Exam: Constitutional: Moderately built and nourished. Vitals:   03/16/21 1937 03/16/21 2045 03/16/21 2130 03/16/21 2221  BP:  (!) 201/109 (!) 196/103 (!) 146/79  Pulse:  88 89 95  Resp:  16 18 16   Temp:      TempSrc:      SpO2:  99% 100% 100%  Weight: 59 kg     Height: 5\' 5"  (1.651 m)      Eyes: Anicteric no pallor. ENMT: No discharge from the ears eyes nose or mouth. Neck: No mass felt.  No neck  rigidity. Respiratory: No rhonchi or crepitations. Cardiovascular: S1-S2 heard. Abdomen: Tenderness in both lower quadrants more on the left side.  No rebound tenderness.  No rigidity. Musculoskeletal: No edema. Skin: No rash. Neurologic: Alert awake oriented to time place and person. Psychiatric: Appears normal.  Normal affect.   Labs on Admission: I have personally reviewed following labs and imaging studies  CBC: Recent Labs  Lab 03/16/21 2040  WBC 14.6*  NEUTROABS 12.1*  HGB 14.4  HCT 42.2  MCV 94.0  PLT 818   Basic Metabolic Panel: Recent Labs  Lab 03/16/21 2040  NA 142  K 3.8  CL 104  CO2 24  GLUCOSE 116*  BUN 11  CREATININE 0.98  CALCIUM 9.8    GFR: Estimated Creatinine Clearance: 59.1 mL/min (by C-G formula based on SCr of 0.98 mg/dL). Liver Function Tests: Recent Labs  Lab 03/16/21 2040  AST 22  ALT 22  ALKPHOS 82  BILITOT 0.9  PROT 7.8  ALBUMIN 4.4   Recent Labs  Lab 03/16/21 2040  LIPASE 38   No results for input(s): AMMONIA in the last 168 hours. Coagulation Profile: No results for input(s): INR, PROTIME in the last 168 hours. Cardiac Enzymes: No results for input(s): CKTOTAL, CKMB, CKMBINDEX, TROPONINI in the last 168 hours. BNP (last 3 results) No results for input(s): PROBNP in the last 8760 hours. HbA1C: No results for input(s): HGBA1C in the last 72 hours. CBG: No results for input(s): GLUCAP in the last 168 hours. Lipid Profile: No results for input(s): CHOL, HDL, LDLCALC, TRIG, CHOLHDL, LDLDIRECT in the last 72 hours. Thyroid Function Tests: No results for input(s): TSH, T4TOTAL, FREET4, T3FREE, THYROIDAB in the last 72 hours. Anemia Panel: No results for input(s): VITAMINB12, FOLATE, FERRITIN, TIBC, IRON, RETICCTPCT in the last 72 hours. Urine analysis: No results found for: COLORURINE, APPEARANCEUR, LABSPEC, PHURINE, GLUCOSEU, HGBUR, BILIRUBINUR, KETONESUR, PROTEINUR, UROBILINOGEN, NITRITE, LEUKOCYTESUR Sepsis Labs: @LABRCNTIP (procalcitonin:4,lacticidven:4) )No results found for this or any previous visit (from the past 240 hour(s)).   Radiological Exams on Admission: CT ABDOMEN PELVIS W CONTRAST  Result Date: 03/16/2021 CLINICAL DATA:  Abdominal distension. Pain. Patient reports no bowel movement for 3-4 days. Vomiting today. EXAM: CT ABDOMEN AND PELVIS WITH CONTRAST TECHNIQUE: Multidetector CT imaging of the abdomen and pelvis was performed using the standard protocol following bolus administration of intravenous contrast. CONTRAST:  164mL OMNIPAQUE IOHEXOL 300 MG/ML  SOLN COMPARISON:  Radiograph earlier today. FINDINGS: Lower chest: The lung bases are clear. No pleural fluid or acute airspace  disease. Hepatobiliary: No focal liver abnormality is seen. No gallstones, gallbladder wall thickening, or biliary dilatation. Pancreas: Unremarkable. No pancreatic ductal dilatation or surrounding inflammatory changes. Spleen: Normal in size without focal abnormality. Adrenals/Urinary Tract: Normal adrenal glands. No hydronephrosis or perinephric edema. Homogeneous renal enhancement with symmetric excretion on delayed phase imaging. No evidence of renal calculi or focal lesion. Urinary bladder is partially distended without wall thickening. Stomach/Bowel: Acute appendicitis as described below. Unremarkable stomach. Proximal small bowel loops are decompressed. Distal small bowel loops are fluid-filled and mildly prominent, maximal dimension 2.5 cm, likely ileus. Mixed liquid and solid stool throughout the colon without colonic wall thickening or inflammation. Appendix: Location: Medial to the cecum. Diameter: 15 mm Appendicolith: Present Mucosal hyper-enhancement: Mild Extraluminal gas: No Periappendiceal collection: No. Periappendiceal fat stranding but no collection or free air. Vascular/Lymphatic: Normal caliber abdominal aorta. Patent portal vein. Few prominent ileocolic nodes are likely reactive. Reproductive: Uterus courses into the left pelvis where it is lobulated, possible fibroids. No evidence  of adnexal mass. Other: Tiny fat containing umbilical hernia. No free air, ascites, or intra-abdominal fluid collection. Musculoskeletal: Mild degenerative change of both sacroiliac joints. There are no acute or suspicious osseous abnormalities. IMPRESSION: Uncomplicated acute appendicitis. Electronically Signed   By: Keith Rake M.D.   On: 03/16/2021 22:22   DG ABD ACUTE 2+V W 1V CHEST  Result Date: 03/16/2021 CLINICAL DATA:  55 year old female with abdominal pain. EXAM: DG ABDOMEN ACUTE WITH 1 VIEW CHEST COMPARISON:  None. FINDINGS: The lungs are clear. There is no pleural effusion pneumothorax. The  cardiac silhouette is within limits. There is no bowel dilatation or evidence of obstruction. No free air or radiopaque calculi. The osseous structures are unremarkable. IMPRESSION: Negative abdominal radiographs. No acute cardiopulmonary disease. Electronically Signed   By: Anner Crete M.D.   On: 03/16/2021 21:17    EKG: Independently reviewed.  Normal sinus rhythm with nonspecific ST-T changes.  Assessment/Plan Principal Problem:   Acute appendicitis Active Problems:   Hypertensive urgency    1. Acute appendicitis -General surgery has been consulted patient will be kept n.p.o. IV antibiotics pain medication and fluids. 2. Hypertensive urgency likely from being noncompliant with her medications.  For now as needed IV labetalol follow blood pressure trends and once after surgery and once patient can take orals we will start different antihypertensives orally. 3. History of cocaine and alcohol abuse.  Advised about quitting.  Closely monitor for any withdrawal signs.   DVT prophylaxis: SCDs.  Avoiding anticoagulants in anticipation of procedure. Code Status: Full code. Family Communication: Discussed with patient. Disposition Plan: Home. Consults called: General surgery. Admission status: Observation.   Rise Patience MD [facility] Pager [phone].  If 7PM-7AM, please contact night-coverage [email or web address] Password [facility]  03/16/2021, 11:22 PM

## 2021-03-16 NOTE — Consult Note (Signed)
.  CC: abd pain  Requesting provider: Dr Darl Householder  HPI: Tracy Fischer is an 55 y.o. female who is here for lower abd pain x 3 days with constipation.  Associated with vomiting today.  Has htn, off meds currently, SBP 200 at presentation  History reviewed. No pertinent past medical history.  Past Surgical History:  Procedure Laterality Date  . CESAREAN SECTION      No family history on file.  Social:  reports that she has been smoking. She has never used smokeless tobacco. She reports current alcohol use. She reports current drug use. Drugs: Cocaine and Marijuana.  Allergies: No Known Allergies  Medications: I have reviewed the patient's current medications.  Results for orders placed or performed during the hospital encounter of 03/16/21 (from the past 48 hour(s))  CBC with Differential/Platelet     Status: Abnormal   Collection Time: 03/16/21  8:40 PM  Result Value Ref Range   WBC 14.6 (H) 4.0 - 10.5 K/uL   RBC 4.49 3.87 - 5.11 MIL/uL   Hemoglobin 14.4 12.0 - 15.0 g/dL   HCT 42.2 36.0 - 46.0 %   MCV 94.0 80.0 - 100.0 fL   MCH 32.1 26.0 - 34.0 pg   MCHC 34.1 30.0 - 36.0 g/dL   RDW 12.4 11.5 - 15.5 %   Platelets 259 150 - 400 K/uL   nRBC 0.0 0.0 - 0.2 %   Neutrophils Relative % 84 %   Neutro Abs 12.1 (H) 1.7 - 7.7 K/uL   Lymphocytes Relative 9 %   Lymphs Abs 1.3 0.7 - 4.0 K/uL   Monocytes Relative 7 %   Monocytes Absolute 1.1 (H) 0.1 - 1.0 K/uL   Eosinophils Relative 0 %   Eosinophils Absolute 0.0 0.0 - 0.5 K/uL   Basophils Relative 0 %   Basophils Absolute 0.0 0.0 - 0.1 K/uL   Immature Granulocytes 0 %   Abs Immature Granulocytes 0.06 0.00 - 0.07 K/uL    Comment: Performed at [facility], [place] [address] [place], [place] [address]  Comprehensive metabolic panel     Status: Abnormal   Collection Time: 03/16/21  8:40 PM  Result Value Ref Range   Sodium 142 135 - 145 mmol/L   Potassium 3.8 3.5 - 5.1 mmol/L   Chloride 104 98 - 111 mmol/L   CO2  24 22 - 32 mmol/L   Glucose, Bld 116 (H) 70 - 99 mg/dL    Comment: Glucose reference range applies only to samples taken after fasting for at least 8 hours.   BUN 11 6 - 20 mg/dL   Creatinine, Ser 0.98 0.44 - 1.00 mg/dL   Calcium 9.8 8.9 - 10.3 mg/dL   Total Protein 7.8 6.5 - 8.1 g/dL   Albumin 4.4 3.5 - 5.0 g/dL   AST 22 15 - 41 U/L   ALT 22 0 - 44 U/L   Alkaline Phosphatase 82 38 - 126 U/L   Total Bilirubin 0.9 0.3 - 1.2 mg/dL   GFR, Estimated >60 >60 mL/min    Comment: (NOTE) Calculated using the CKD-EPI Creatinine Equation (2021)    Anion gap 14 5 - 15    Comment: Performed at [facility], [place] [address], [place], [place] [address]  Lipase, blood     Status: None   Collection Time: 03/16/21  8:40 PM  Result Value Ref Range   Lipase 38 11 - 51 U/L    Comment: Performed at [facility], [address] [place], [place] [address]    CT ABDOMEN PELVIS W CONTRAST  Result Date: 03/16/2021 CLINICAL DATA:  Abdominal distension. Pain. Patient reports no bowel movement for 3-4 days. Vomiting today. EXAM: CT ABDOMEN AND PELVIS WITH CONTRAST TECHNIQUE: Multidetector CT imaging of the abdomen and pelvis was performed using the standard protocol following bolus administration of intravenous contrast. CONTRAST:  152mL OMNIPAQUE IOHEXOL 300 MG/ML  SOLN COMPARISON:  Radiograph earlier today. FINDINGS: Lower chest: The lung bases are clear. No pleural fluid or acute airspace disease. Hepatobiliary: No focal liver abnormality is seen. No gallstones, gallbladder wall thickening, or biliary dilatation. Pancreas: Unremarkable. No pancreatic ductal dilatation or surrounding inflammatory changes. Spleen: Normal in size without focal abnormality. Adrenals/Urinary Tract: Normal adrenal glands. No hydronephrosis or perinephric edema. Homogeneous renal enhancement with symmetric excretion on delayed phase imaging. No evidence of renal calculi or focal lesion. Urinary bladder  is partially distended without wall thickening. Stomach/Bowel: Acute appendicitis as described below. Unremarkable stomach. Proximal small bowel loops are decompressed. Distal small bowel loops are fluid-filled and mildly prominent, maximal dimension 2.5 cm, likely ileus. Mixed liquid and solid stool throughout the colon without colonic wall thickening or inflammation. Appendix: Location: Medial to the cecum. Diameter: 15 mm Appendicolith: Present Mucosal hyper-enhancement: Mild Extraluminal gas: No Periappendiceal collection: No. Periappendiceal fat stranding but no collection or free air. Vascular/Lymphatic: Normal caliber abdominal aorta. Patent portal vein. Few prominent ileocolic nodes are likely reactive. Reproductive: Uterus courses into the left pelvis where it is lobulated, possible fibroids. No evidence of adnexal mass. Other: Tiny fat containing umbilical hernia. No free air, ascites, or intra-abdominal fluid collection. Musculoskeletal: Mild degenerative change of both sacroiliac joints. There are no acute or suspicious osseous abnormalities. IMPRESSION: Uncomplicated acute appendicitis. Electronically Signed   By: Keith Rake M.D.   On: 03/16/2021 22:22   DG ABD ACUTE 2+V W 1V CHEST  Result Date: 03/16/2021 CLINICAL DATA:  55 year old female with abdominal pain. EXAM: DG ABDOMEN ACUTE WITH 1 VIEW CHEST COMPARISON:  None. FINDINGS: The lungs are clear. There is no pleural effusion pneumothorax. The cardiac silhouette is within limits. There is no bowel dilatation or evidence of obstruction. No free air or radiopaque calculi. The osseous structures are unremarkable. IMPRESSION: Negative abdominal radiographs. No acute cardiopulmonary disease. Electronically Signed   By: Anner Crete M.D.   On: 03/16/2021 21:17    ROS - all of the below systems have been reviewed with the patient and positives are indicated with bold text General: chills, fever or night sweats Eyes: blurry vision or  double vision ENT: epistaxis or sore throat Allergy/Immunology: itchy/watery eyes or nasal congestion Hematologic/Lymphatic: bleeding problems, blood clots or swollen lymph nodes Endocrine: temperature intolerance or unexpected weight changes Breast: new or changing breast lumps or nipple discharge Resp: cough, shortness of breath, or wheezing CV: chest pain or dyspnea on exertion GI: as per HPI GU: dysuria, trouble voiding, or hematuria MSK: joint pain or joint stiffness Neuro: TIA or stroke symptoms Derm: pruritus and skin lesion changes Psych: anxiety and depression  PE Blood pressure (!) 146/79, pulse 95, temperature 97.8 F (36.6 C), temperature source Oral, resp. rate 16, height 5\' 5"  (1.651 m), weight 59 kg, SpO2 100 %. Constitutional: NAD; conversant; no deformities Eyes: Moist conjunctiva; no lid lag; anicteric; PERRL Neck: Trachea midline; no thyromegaly Lungs: Normal respiratory effort; no tactile fremitus CV: RRR; no palpable thrills; no pitting edema GI: Abd ; TTP suprapubic area, no palpable hepatosplenomegaly MSK: Normal range of motion of extremities;  no clubbing/cyanosis Psychiatric: Appropriate affect; alert and oriented x3 Lymphatic: No palpable cervical or axillary lymphadenopathy  Results for orders placed or performed during the hospital encounter of 03/16/21 (from the past 48 hour(s))  CBC with Differential/Platelet     Status: Abnormal   Collection Time: 03/16/21  8:40 PM  Result Value Ref Range   WBC 14.6 (H) 4.0 - 10.5 K/uL   RBC 4.49 3.87 - 5.11 MIL/uL   Hemoglobin 14.4 12.0 - 15.0 g/dL   HCT 42.2 36.0 - 46.0 %   MCV 94.0 80.0 - 100.0 fL   MCH 32.1 26.0 - 34.0 pg   MCHC 34.1 30.0 - 36.0 g/dL   RDW 12.4 11.5 - 15.5 %   Platelets 259 150 - 400 K/uL   nRBC 0.0 0.0 - 0.2 %   Neutrophils Relative % 84 %   Neutro Abs 12.1 (H) 1.7 - 7.7 K/uL   Lymphocytes Relative 9 %   Lymphs Abs 1.3 0.7 - 4.0 K/uL   Monocytes Relative 7 %   Monocytes Absolute 1.1  (H) 0.1 - 1.0 K/uL   Eosinophils Relative 0 %   Eosinophils Absolute 0.0 0.0 - 0.5 K/uL   Basophils Relative 0 %   Basophils Absolute 0.0 0.0 - 0.1 K/uL   Immature Granulocytes 0 %   Abs Immature Granulocytes 0.06 0.00 - 0.07 K/uL    Comment: Performed at [facility], [place] [address] [place], [place] [address]  Comprehensive metabolic panel     Status: Abnormal   Collection Time: 03/16/21  8:40 PM  Result Value Ref Range   Sodium 142 135 - 145 mmol/L   Potassium 3.8 3.5 - 5.1 mmol/L   Chloride 104 98 - 111 mmol/L   CO2 24 22 - 32 mmol/L   Glucose, Bld 116 (H) 70 - 99 mg/dL    Comment: Glucose reference range applies only to samples taken after fasting for at least 8 hours.   BUN 11 6 - 20 mg/dL   Creatinine, Ser 0.98 0.44 - 1.00 mg/dL   Calcium 9.8 8.9 - 10.3 mg/dL   Total Protein 7.8 6.5 - 8.1 g/dL   Albumin 4.4 3.5 - 5.0 g/dL   AST 22 15 - 41 U/L   ALT 22 0 - 44 U/L   Alkaline Phosphatase 82 38 - 126 U/L   Total Bilirubin 0.9 0.3 - 1.2 mg/dL   GFR, Estimated >60 >60 mL/min    Comment: (NOTE) Calculated using the CKD-EPI Creatinine Equation (2021)    Anion gap 14 5 - 15    Comment: Performed at [facility], [place] [address], [place], [place] [address]  Lipase, blood     Status: None   Collection Time: 03/16/21  8:40 PM  Result Value Ref Range   Lipase 38 11 - 51 U/L    Comment: Performed at [facility], [place] [address], [place], [place] [address]    CT ABDOMEN PELVIS W CONTRAST  Result Date: 03/16/2021 CLINICAL DATA:  Abdominal distension. Pain. Patient reports no bowel movement for 3-4 days. Vomiting today. EXAM: CT ABDOMEN AND PELVIS WITH CONTRAST TECHNIQUE: Multidetector CT imaging of the abdomen and pelvis was performed using the standard protocol following bolus administration of intravenous contrast. CONTRAST:  169mL OMNIPAQUE IOHEXOL 300 MG/ML  SOLN COMPARISON:  Radiograph earlier today. FINDINGS: Lower chest:  The lung bases are clear. No pleural fluid or acute airspace disease. Hepatobiliary: No focal liver abnormality is seen. No gallstones, gallbladder wall thickening,  or biliary dilatation. Pancreas: Unremarkable. No pancreatic ductal dilatation or surrounding inflammatory changes. Spleen: Normal in size without focal abnormality. Adrenals/Urinary Tract: Normal adrenal glands. No hydronephrosis or perinephric edema. Homogeneous renal enhancement with symmetric excretion on delayed phase imaging. No evidence of renal calculi or focal lesion. Urinary bladder is partially distended without wall thickening. Stomach/Bowel: Acute appendicitis as described below. Unremarkable stomach. Proximal small bowel loops are decompressed. Distal small bowel loops are fluid-filled and mildly prominent, maximal dimension 2.5 cm, likely ileus. Mixed liquid and solid stool throughout the colon without colonic wall thickening or inflammation. Appendix: Location: Medial to the cecum. Diameter: 15 mm Appendicolith: Present Mucosal hyper-enhancement: Mild Extraluminal gas: No Periappendiceal collection: No. Periappendiceal fat stranding but no collection or free air. Vascular/Lymphatic: Normal caliber abdominal aorta. Patent portal vein. Few prominent ileocolic nodes are likely reactive. Reproductive: Uterus courses into the left pelvis where it is lobulated, possible fibroids. No evidence of adnexal mass. Other: Tiny fat containing umbilical hernia. No free air, ascites, or intra-abdominal fluid collection. Musculoskeletal: Mild degenerative change of both sacroiliac joints. There are no acute or suspicious osseous abnormalities. IMPRESSION: Uncomplicated acute appendicitis. Electronically Signed   By: Keith Rake M.D.   On: 03/16/2021 22:22   DG ABD ACUTE 2+V W 1V CHEST  Result Date: 03/16/2021 CLINICAL DATA:  55 year old female with abdominal pain. EXAM: DG ABDOMEN ACUTE WITH 1 VIEW CHEST COMPARISON:  None. FINDINGS: The lungs are  clear. There is no pleural effusion pneumothorax. The cardiac silhouette is within limits. There is no bowel dilatation or evidence of obstruction. No free air or radiopaque calculi. The osseous structures are unremarkable. IMPRESSION: Negative abdominal radiographs. No acute cardiopulmonary disease. Electronically Signed   By: Anner Crete M.D.   On: 03/16/2021 21:17     A/P: Tracy Fischer is an 55 y.o. female with uncontrolled htn, constipation and lower abd pain.  She has an elevated wbc and CT scan showing acute appendicitis on a medialized appendix.    Pt admitted to medicine for bp control.  IV abx and npo for appendicitis.  Will plan on lap appy later this am.     Rosario Adie, MD  [facility]

## 2021-03-16 NOTE — ED Notes (Signed)
ED TO INPATIENT HANDOFF REPORT  Name/Age/Gender Tracy Fischer 55 y.o. female  Code Status    Code Status Orders  (From admission, onward)         Start     Ordered   03/16/21 2320  Full code  Continuous        03/16/21 2321        Code Status History    This patient has a current code status but no historical code status.   Advance Care Planning Activity      Home/SNF/Other Home  Chief Complaint Acute appendicitis [K35.80]  Level of Care/Admitting Diagnosis ED Disposition    ED Disposition Condition Comment   Admit  Hospital Area: [facility] [[id number]]  Level of Care: Telemetry [5]  Admit to tele based on following criteria: Monitor for Ischemic changes  Covid Evaluation: Asymptomatic Screening Protocol (No Symptoms)  Diagnosis: Acute appendicitis [035009]  Admitting Physician: Rise Patience [phone]  Attending Physician: Rise Patience [phone]       Medical History Past Medical History:  Diagnosis Date  . Hypertension     Allergies No Known Allergies  IV Location/Drains/Wounds Patient Lines/Drains/Airways Status    Active Line/Drains/Airways    Name Placement date Placement time Site Days   Peripheral IV 03/16/21 Right Antecubital 03/16/21  2040  Antecubital  less than 1          Labs/Imaging Results for orders placed or performed during the hospital encounter of 03/16/21 (from the past 48 hour(s))  CBC with Differential/Platelet     Status: Abnormal   Collection Time: 03/16/21  8:40 PM  Result Value Ref Range   WBC 14.6 (H) 4.0 - 10.5 K/uL   RBC 4.49 3.87 - 5.11 MIL/uL   Hemoglobin 14.4 12.0 - 15.0 g/dL   HCT 42.2 36.0 - 46.0 %   MCV 94.0 80.0 - 100.0 fL   MCH 32.1 26.0 - 34.0 pg   MCHC 34.1 30.0 - 36.0 g/dL   RDW 12.4 11.5 - 15.5 %   Platelets 259 150 - 400 K/uL   nRBC 0.0 0.0 - 0.2 %   Neutrophils Relative % 84 %   Neutro Abs 12.1 (H) 1.7 - 7.7 K/uL   Lymphocytes Relative 9 %   Lymphs Abs 1.3 0.7  - 4.0 K/uL   Monocytes Relative 7 %   Monocytes Absolute 1.1 (H) 0.1 - 1.0 K/uL   Eosinophils Relative 0 %   Eosinophils Absolute 0.0 0.0 - 0.5 K/uL   Basophils Relative 0 %   Basophils Absolute 0.0 0.0 - 0.1 K/uL   Immature Granulocytes 0 %   Abs Immature Granulocytes 0.06 0.00 - 0.07 K/uL    Comment: Performed at [facility], [place] [address], [place], [place] [address]  Comprehensive metabolic panel     Status: Abnormal   Collection Time: 03/16/21  8:40 PM  Result Value Ref Range   Sodium 142 135 - 145 mmol/L   Potassium 3.8 3.5 - 5.1 mmol/L   Chloride 104 98 - 111 mmol/L   CO2 24 22 - 32 mmol/L   Glucose, Bld 116 (H) 70 - 99 mg/dL    Comment: Glucose reference range applies only to samples taken after fasting for at least 8 hours.   BUN 11 6 - 20 mg/dL   Creatinine, Ser 0.98 0.44 - 1.00 mg/dL   Calcium 9.8 8.9 - 10.3 mg/dL   Total Protein 7.8 6.5 - 8.1 g/dL   Albumin 4.4 3.5 -  5.0 g/dL   AST 22 15 - 41 U/L   ALT 22 0 - 44 U/L   Alkaline Phosphatase 82 38 - 126 U/L   Total Bilirubin 0.9 0.3 - 1.2 mg/dL   GFR, Estimated >60 >60 mL/min    Comment: (NOTE) Calculated using the CKD-EPI Creatinine Equation (2021)    Anion gap 14 5 - 15    Comment: Performed at [facility], [place] [address] [place], [place] [address]  Lipase, blood     Status: None   Collection Time: 03/16/21  8:40 PM  Result Value Ref Range   Lipase 38 11 - 51 U/L    Comment: Performed at [facility], [place] [address], [place], [place] [address]  Resp Panel by RT-PCR (Flu A&B, Covid) Nasopharyngeal Swab     Status: None   Collection Time: 03/16/21 10:37 PM   Specimen: Nasopharyngeal Swab; Nasopharyngeal(NP) swabs in vial transport medium  Result Value Ref Range   SARS Coronavirus 2 by RT PCR NEGATIVE NEGATIVE    Comment: (NOTE) SARS-CoV-2 target nucleic acids are NOT DETECTED.  The SARS-CoV-2 RNA is generally detectable in upper respiratory specimens  during the acute phase of infection. The lowest concentration of SARS-CoV-2 viral copies this assay can detect is 138 copies/mL. A negative result does not preclude SARS-Cov-2 infection and should not be used as the sole basis for treatment or other patient management decisions. A negative result may occur with  improper specimen collection/handling, submission of specimen other than nasopharyngeal swab, presence of viral mutation(s) within the areas targeted by this assay, and inadequate number of viral copies(<138 copies/mL). A negative result must be combined with clinical observations, patient history, and epidemiological information. The expected result is Negative.  Fact Sheet for Patients:  EntrepreneurPulse.com.au  Fact Sheet for Healthcare Providers:  IncredibleEmployment.be  This test is no t yet approved or cleared by the [place] FDA and  has been authorized for detection and/or diagnosis of SARS-CoV-2 by FDA under an Emergency Use Authorization (EUA). This EUA will remain  in effect (meaning this test can be used) for the duration of the COVID-19 declaration under Section 564(b)(1) of the Act, 21 U.S.C.section 360bbb-3(b)(1), unless the authorization is terminated  or revoked sooner.       Influenza A by PCR NEGATIVE NEGATIVE   Influenza B by PCR NEGATIVE NEGATIVE    Comment: (NOTE) The Xpert Xpress SARS-CoV-2/FLU/RSV plus assay is intended as an aid in the diagnosis of influenza from Nasopharyngeal swab specimens and should not be used as a sole basis for treatment. Nasal washings and aspirates are unacceptable for Xpert Xpress SARS-CoV-2/FLU/RSV testing.  Fact Sheet for Patients: EntrepreneurPulse.com.au  Fact Sheet for Healthcare Providers: IncredibleEmployment.be  This test is not yet approved or cleared by the [place] FDA and has been authorized for detection and/or diagnosis  of SARS-CoV-2 by FDA under an Emergency Use Authorization (EUA). This EUA will remain in effect (meaning this test can be used) for the duration of the COVID-19 declaration under Section 564(b)(1) of the Act, 21 U.S.C. section 360bbb-3(b)(1), unless the authorization is terminated or revoked.  Performed at [facility], [place] [address], [place], [place] [address]    CT ABDOMEN PELVIS W CONTRAST  Result Date: 03/16/2021 CLINICAL DATA:  Abdominal distension. Pain. Patient reports no bowel movement for 3-4 days. Vomiting today. EXAM: CT ABDOMEN AND PELVIS WITH CONTRAST TECHNIQUE: Multidetector CT imaging of the abdomen and pelvis was performed using the standard protocol following  bolus administration of intravenous contrast. CONTRAST:  165mL OMNIPAQUE IOHEXOL 300 MG/ML  SOLN COMPARISON:  Radiograph earlier today. FINDINGS: Lower chest: The lung bases are clear. No pleural fluid or acute airspace disease. Hepatobiliary: No focal liver abnormality is seen. No gallstones, gallbladder wall thickening, or biliary dilatation. Pancreas: Unremarkable. No pancreatic ductal dilatation or surrounding inflammatory changes. Spleen: Normal in size without focal abnormality. Adrenals/Urinary Tract: Normal adrenal glands. No hydronephrosis or perinephric edema. Homogeneous renal enhancement with symmetric excretion on delayed phase imaging. No evidence of renal calculi or focal lesion. Urinary bladder is partially distended without wall thickening. Stomach/Bowel: Acute appendicitis as described below. Unremarkable stomach. Proximal small bowel loops are decompressed. Distal small bowel loops are fluid-filled and mildly prominent, maximal dimension 2.5 cm, likely ileus. Mixed liquid and solid stool throughout the colon without colonic wall thickening or inflammation. Appendix: Location: Medial to the cecum. Diameter: 15 mm Appendicolith: Present Mucosal hyper-enhancement: Mild Extraluminal gas: No  Periappendiceal collection: No. Periappendiceal fat stranding but no collection or free air. Vascular/Lymphatic: Normal caliber abdominal aorta. Patent portal vein. Few prominent ileocolic nodes are likely reactive. Reproductive: Uterus courses into the left pelvis where it is lobulated, possible fibroids. No evidence of adnexal mass. Other: Tiny fat containing umbilical hernia. No free air, ascites, or intra-abdominal fluid collection. Musculoskeletal: Mild degenerative change of both sacroiliac joints. There are no acute or suspicious osseous abnormalities. IMPRESSION: Uncomplicated acute appendicitis. Electronically Signed   By: Keith Rake M.D.   On: 03/16/2021 22:22   DG ABD ACUTE 2+V W 1V CHEST  Result Date: 03/16/2021 CLINICAL DATA:  55 year old female with abdominal pain. EXAM: DG ABDOMEN ACUTE WITH 1 VIEW CHEST COMPARISON:  None. FINDINGS: The lungs are clear. There is no pleural effusion pneumothorax. The cardiac silhouette is within limits. There is no bowel dilatation or evidence of obstruction. No free air or radiopaque calculi. The osseous structures are unremarkable. IMPRESSION: Negative abdominal radiographs. No acute cardiopulmonary disease. Electronically Signed   By: Anner Crete M.D.   On: 03/16/2021 21:17    Pending Labs Unresulted Labs (From admission, onward)          Start     Ordered   03/17/21 [id number]  Basic metabolic panel  Tomorrow morning,   R        03/16/21 2321   03/17/21 0500  CBC  Tomorrow morning,   R        03/16/21 2321   03/16/21 2319  HIV Antibody (routine testing w rflx)  (HIV Antibody (Routine testing w reflex) panel)  Once,   STAT        03/16/21 2321   03/16/21 2238  Urinalysis, Routine w reflex microscopic  Once,   STAT        03/16/21 2237          Vitals/Pain Today's Vitals   03/16/21 2221 03/16/21 2224 03/16/21 2300 03/16/21 2327  BP: (!) 146/79  (!) 149/74   Pulse: 95  95   Resp: 16  16   Temp:   98 F (36.7 C)   TempSrc:       SpO2: 100%  97%   Weight:      Height:      PainSc:  10-Worst pain ever  2     Isolation Precautions No active isolations  Medications Medications  cefTRIAXone (ROCEPHIN) 2 g in sodium chloride 0.9 % 100 mL IVPB (2 g Intravenous New Bag/Given 03/16/21 2320)    And  metroNIDAZOLE (FLAGYL) IVPB 500 mg (has  no administration in time range)  dextrose 5 %-0.9 % sodium chloride infusion (has no administration in time range)  morphine 2 MG/ML injection 1 mg (has no administration in time range)  hydrALAZINE (APRESOLINE) injection 10 mg (has no administration in time range)  thiamine (B-1) injection 100 mg (has no administration in time range)  cefTRIAXone (ROCEPHIN) 2 g in sodium chloride 0.9 % 100 mL IVPB (has no administration in time range)  metroNIDAZOLE (FLAGYL) IVPB 500 mg (has no administration in time range)  sodium chloride 0.9 % bolus 1,000 mL (0 mLs Intravenous Stopped 03/16/21 2200)  ondansetron (ZOFRAN) injection 4 mg (4 mg Intravenous Given 03/16/21 2040)  sodium phosphate (FLEET) 7-19 GM/118ML enema 1 enema (1 enema Rectal Given 03/16/21 2041)  lactulose (CHRONULAC) 10 GM/15ML solution 30 g (30 g Oral Given 03/16/21 2041)  morphine 4 MG/ML injection 4 mg (4 mg Intravenous Given 03/16/21 2146)  hydrALAZINE (APRESOLINE) injection 10 mg (10 mg Intravenous Given 03/16/21 2146)  iohexol (OMNIPAQUE) 300 MG/ML solution 100 mL (100 mLs Intravenous Contrast Given 03/16/21 2157)  HYDROmorphone (DILAUDID) injection 1 mg (1 mg Intravenous Given 03/16/21 2242)    Mobility Ambulatory

## 2021-03-16 NOTE — ED Triage Notes (Signed)
Pt reports upper abdominal pain x 3-4 days getting worse last night. Pt sts no bowel movement is 3-4 days also. 2 episodes of vomiting today.

## 2021-03-16 NOTE — ED Provider Notes (Signed)
[facility] DEPT Provider Note   CSN: [id number] Arrival date & time: 03/16/21  1920     History Chief Complaint  Patient presents with  . Abdominal Pain  . Constipation    Tracy Fischer is a 55 y.o. female here with abdominal pain and constipation.  Patient has been constipated for the last 3 days.  Patient tried milk of magnesia with no relief.  Patient had 2 episodes of vomiting today.  Patient has a history of hypertension and has not been taking her meds.  Patient denies any history of bowel obstructions before.   The history is provided by the patient.       History reviewed. No pertinent past medical history.  There are no problems to display for this patient.   Past Surgical History:  Procedure Laterality Date  . CESAREAN SECTION       OB History   No obstetric history on file.     No family history on file.  Social History   Tobacco Use  . Smoking status: Current Every Day Smoker  . Smokeless tobacco: Never Used  Substance Use Topics  . Alcohol use: Yes    Comment: weekly   . Drug use: Yes    Types: Cocaine, Marijuana    Home Medications Prior to Admission medications   Medication Sig Start Date End Date Taking? Authorizing Provider  bismuth subsalicylate (PEPTO BISMOL) 262 MG/15ML suspension Take 30 mLs by mouth every 6 (six) hours as needed. For upset stomach    [provider]  ferrous sulfate 325 (65 FE) MG tablet Take 1 tablet (325 mg total) by mouth daily. 06/14/15   Cartner, Marland Kitchen, PA-C  ibuprofen (ADVIL,MOTRIN) 200 MG tablet Take 600 mg by mouth every 6 (six) hours as needed for mild pain.     [provider]  omeprazole (PRILOSEC) 20 MG capsule Take 1 capsule (20 mg total) by mouth daily. 06/14/15   Comer Locket, PA-C    Allergies    Patient has no known allergies.  Review of Systems   Review of Systems  Gastrointestinal: Positive for abdominal pain and constipation.  All  other systems reviewed and are negative.   Physical Exam Updated Vital Signs BP (!) 146/79   Pulse 95   Temp 97.8 F (36.6 C) (Oral)   Resp 16   Ht 5\' 5"  (1.651 m)   Wt 59 kg   SpO2 100%   BMI 21.63 kg/m   Physical Exam Vitals and nursing note reviewed.  HENT:     Head: Normocephalic.     Mouth/Throat:     Pharynx: Oropharynx is clear.  Eyes:     Extraocular Movements: Extraocular movements intact.     Pupils: Pupils are equal, round, and reactive to light.  Cardiovascular:     Rate and Rhythm: Normal rate and regular rhythm.  Abdominal:     General: Abdomen is flat.     Comments: Distended, mild LLQ tenderness   Genitourinary:    Comments: Rectal- no obvious stool impaction  Skin:    General: Skin is warm.     Capillary Refill: Capillary refill takes less than 2 seconds.  Neurological:     General: No focal deficit present.     Mental Status: She is alert and oriented to person, place, and time.  Psychiatric:        Mood and Affect: Mood normal.        Behavior: Behavior normal.     ED  Results / Procedures / Treatments   Labs (all labs ordered are listed, but only abnormal results are displayed) Labs Reviewed  CBC WITH DIFFERENTIAL/PLATELET - Abnormal; Notable for the following components:      Result Value   WBC 14.6 (*)    Neutro Abs 12.1 (*)    Monocytes Absolute 1.1 (*)    All other components within normal limits  COMPREHENSIVE METABOLIC PANEL - Abnormal; Notable for the following components:   Glucose, Bld 116 (*)    All other components within normal limits  RESP PANEL BY RT-PCR (FLU A&B, COVID) [id number]  LIPASE, BLOOD  URINALYSIS, ROUTINE W REFLEX MICROSCOPIC    EKG EKG Interpretation  Date/Time:  Wednesday March 16 2021 20:31:59 EDT Ventricular Rate:  77 PR Interval:  162 QRS Duration: 82 QT Interval:  384 QTC Calculation: 434 R Axis:   58 Text Interpretation: Normal sinus rhythm Minimal voltage criteria for LVH, may be normal variant (  Sokolow-Lyon ) Borderline ECG No significant change since last tracing Confirmed by Wandra Arthurs [id number]) on 03/16/2021 8:35:54 PM   Radiology CT ABDOMEN PELVIS W CONTRAST  Result Date: 03/16/2021 CLINICAL DATA:  Abdominal distension. Pain. Patient reports no bowel movement for 3-4 days. Vomiting today. EXAM: CT ABDOMEN AND PELVIS WITH CONTRAST TECHNIQUE: Multidetector CT imaging of the abdomen and pelvis was performed using the standard protocol following bolus administration of intravenous contrast. CONTRAST:  172mL OMNIPAQUE IOHEXOL 300 MG/ML  SOLN COMPARISON:  Radiograph earlier today. FINDINGS: Lower chest: The lung bases are clear. No pleural fluid or acute airspace disease. Hepatobiliary: No focal liver abnormality is seen. No gallstones, gallbladder wall thickening, or biliary dilatation. Pancreas: Unremarkable. No pancreatic ductal dilatation or surrounding inflammatory changes. Spleen: Normal in size without focal abnormality. Adrenals/Urinary Tract: Normal adrenal glands. No hydronephrosis or perinephric edema. Homogeneous renal enhancement with symmetric excretion on delayed phase imaging. No evidence of renal calculi or focal lesion. Urinary bladder is partially distended without wall thickening. Stomach/Bowel: Acute appendicitis as described below. Unremarkable stomach. Proximal small bowel loops are decompressed. Distal small bowel loops are fluid-filled and mildly prominent, maximal dimension 2.5 cm, likely ileus. Mixed liquid and solid stool throughout the colon without colonic wall thickening or inflammation. Appendix: Location: Medial to the cecum. Diameter: 15 mm Appendicolith: Present Mucosal hyper-enhancement: Mild Extraluminal gas: No Periappendiceal collection: No. Periappendiceal fat stranding but no collection or free air. Vascular/Lymphatic: Normal caliber abdominal aorta. Patent portal vein. Few prominent ileocolic nodes are likely reactive. Reproductive: Uterus courses into the left  pelvis where it is lobulated, possible fibroids. No evidence of adnexal mass. Other: Tiny fat containing umbilical hernia. No free air, ascites, or intra-abdominal fluid collection. Musculoskeletal: Mild degenerative change of both sacroiliac joints. There are no acute or suspicious osseous abnormalities. IMPRESSION: Uncomplicated acute appendicitis. Electronically Signed   By: Keith Rake M.D.   On: 03/16/2021 22:22   DG ABD ACUTE 2+V W 1V CHEST  Result Date: 03/16/2021 CLINICAL DATA:  55 year old female with abdominal pain. EXAM: DG ABDOMEN ACUTE WITH 1 VIEW CHEST COMPARISON:  None. FINDINGS: The lungs are clear. There is no pleural effusion pneumothorax. The cardiac silhouette is within limits. There is no bowel dilatation or evidence of obstruction. No free air or radiopaque calculi. The osseous structures are unremarkable. IMPRESSION: Negative abdominal radiographs. No acute cardiopulmonary disease. Electronically Signed   By: Anner Crete M.D.   On: 03/16/2021 21:17    Procedures Procedures   Medications Ordered in ED Medications  cefTRIAXone (ROCEPHIN) 2 g  in sodium chloride 0.9 % 100 mL IVPB (has no administration in time range)    And  metroNIDAZOLE (FLAGYL) IVPB 500 mg (has no administration in time range)  sodium chloride 0.9 % bolus 1,000 mL (0 mLs Intravenous Stopped 03/16/21 2200)  ondansetron (ZOFRAN) injection 4 mg (4 mg Intravenous Given 03/16/21 2040)  sodium phosphate (FLEET) 7-19 GM/118ML enema 1 enema (1 enema Rectal Given 03/16/21 2041)  lactulose (CHRONULAC) 10 GM/15ML solution 30 g (30 g Oral Given 03/16/21 2041)  morphine 4 MG/ML injection 4 mg (4 mg Intravenous Given 03/16/21 2146)  hydrALAZINE (APRESOLINE) injection 10 mg (10 mg Intravenous Given 03/16/21 2146)  iohexol (OMNIPAQUE) 300 MG/ML solution 100 mL (100 mLs Intravenous Contrast Given 03/16/21 2157)  HYDROmorphone (DILAUDID) injection 1 mg (1 mg Intravenous Given 03/16/21 2242)    ED Course  I have  reviewed the triage vital signs and the nursing notes.  Pertinent labs & imaging results that were available during my care of the patient were reviewed by me and considered in my medical decision making (see chart for details).    MDM Rules/Calculators/A&P                         LANESSA SHILL is a 55 y.o. female here with constipation and hypertension.  Patient has a history of hypertension.  Patient also was constipated for 3 days .  We will get basic blood work and get x-rays and try enema and lactulose.   10:43 PM Patient had small bowel movement after lactulose and enema but is still in pain.  White blood cell count is 15.  CT abdomen pelvis ordered and showed acute appendicitis.  Patient is still in significant pain.  I talked to Dr. Marcello Moores from surgery.  She states that since patient's blood pressure was 200 she will need to be stabilized before she goes to surgery.  She recommend that medicine be the primary team to manage the blood pressure and she will consult.  Patient's blood pressure is down to 140s now after hydralazine.  We will give her IV antibiotics for appendicitis. COVID test sent.    Final Clinical Impression(s) / ED Diagnoses Final diagnoses:  None    Rx / DC Orders ED Discharge Orders    None       Drenda Freeze, MD 03/16/21 2245

## 2021-03-17 ENCOUNTER — Encounter (HOSPITAL_COMMUNITY): Admission: EM | Disposition: A | Discharge status: Home / Self Care | Payer: Self-pay | Source: Home / Self Care

## 2021-03-17 ENCOUNTER — Inpatient Hospital Stay (HOSPITAL_COMMUNITY): Payer: Self-pay | Admitting: Certified Registered Nurse Anesthetist

## 2021-03-17 ENCOUNTER — Encounter (HOSPITAL_COMMUNITY): Payer: Self-pay | Admitting: Internal Medicine

## 2021-03-17 HISTORY — PX: LAPAROSCOPIC APPENDECTOMY: SHX408

## 2021-03-17 LAB — URINALYSIS, ROUTINE W REFLEX MICROSCOPIC
Bacteria, UA: NONE SEEN
Bilirubin Urine: NEGATIVE
Glucose, UA: NEGATIVE mg/dL
Hgb urine dipstick: NEGATIVE
Ketones, ur: 20 mg/dL — AB
Nitrite: NEGATIVE
Protein, ur: NEGATIVE mg/dL
Specific Gravity, Urine: >1.046 — ABNORMAL HIGH (ref 1.005–1.030)
pH: 5 (ref 5.0–8.0)

## 2021-03-17 LAB — CBC
HCT: 38.5 % (ref 36.0–46.0)
Hemoglobin: 12.9 g/dL (ref 12.0–15.0)
MCH: 32.6 pg (ref 26.0–34.0)
MCHC: 33.5 g/dL (ref 30.0–36.0)
MCV: 97.2 fL (ref 80.0–100.0)
Platelets: 238 10*3/uL (ref 150–400)
RBC: 3.96 MIL/uL (ref 3.87–5.11)
RDW: 12.7 % (ref 11.5–15.5)
WBC: 14.7 10*3/uL — ABNORMAL HIGH (ref 4.0–10.5)
nRBC: 0 % (ref 0.0–0.2)

## 2021-03-17 LAB — GLUCOSE, CAPILLARY
Glucose-Capillary: 117 mg/dL — ABNORMAL HIGH (ref 70–99)
Glucose-Capillary: 137 mg/dL — ABNORMAL HIGH (ref 70–99)
Glucose-Capillary: 167 mg/dL — ABNORMAL HIGH (ref 70–99)
Glucose-Capillary: 173 mg/dL — ABNORMAL HIGH (ref 70–99)

## 2021-03-17 LAB — BASIC METABOLIC PANEL
Anion gap: 8 (ref 5–15)
BUN: 8 mg/dL (ref 6–20)
CO2: 25 mmol/L (ref 22–32)
Calcium: 8.8 mg/dL — ABNORMAL LOW (ref 8.9–10.3)
Chloride: 109 mmol/L (ref 98–111)
Creatinine, Ser: 0.82 mg/dL (ref 0.44–1.00)
GFR, Estimated: >60 mL/min (ref >60)
Glucose, Bld: 157 mg/dL — ABNORMAL HIGH (ref 70–99)
Potassium: 3.6 mmol/L (ref 3.5–5.1)
Sodium: 142 mmol/L (ref 135–145)

## 2021-03-17 LAB — HIV ANTIBODY (ROUTINE TESTING W REFLEX): HIV Screen 4th Generation wRfx: NONREACTIVE

## 2021-03-17 SURGERY — APPENDECTOMY, LAPAROSCOPIC
Anesthesia: General

## 2021-03-17 MED ORDER — OXYCODONE HCL 5 MG PO TABS
5.0000 mg | ORAL_TABLET | ORAL | Status: DC | PRN
  Administered 2021-03-17: 10 mg via ORAL
  Filled 2021-03-17: qty 2

## 2021-03-17 MED ORDER — METHOCARBAMOL 500 MG PO TABS
500.0000 mg | ORAL_TABLET | Freq: Four times a day (QID) | ORAL | Status: DC | PRN
  Administered 2021-03-17 – 2021-03-18 (×2): 500 mg via ORAL
  Filled 2021-03-17 (×2): qty 1

## 2021-03-17 MED ORDER — THIAMINE HCL 100 MG/ML IJ SOLN
100.0000 mg | Freq: Every day | INTRAMUSCULAR | Status: DC
  Administered 2021-03-19: 100 mg via INTRAVENOUS
  Filled 2021-03-17: qty 2

## 2021-03-17 MED ORDER — MIDAZOLAM HCL 5 MG/5ML IJ SOLN
INTRAMUSCULAR | Status: DC | PRN
  Administered 2021-03-17: 1 mg via INTRAVENOUS

## 2021-03-17 MED ORDER — OXYCODONE HCL 5 MG/5ML PO SOLN: 5.0000 mg | Freq: Once | ORAL | Status: DC | PRN

## 2021-03-17 MED ORDER — HYDROMORPHONE HCL 1 MG/ML IJ SOLN: 1.0000 mg | Freq: Once | INTRAMUSCULAR | Status: DC

## 2021-03-17 MED ORDER — HYDROMORPHONE HCL 1 MG/ML IJ SOLN: 0.2500 mg | INTRAMUSCULAR | Status: DC | PRN

## 2021-03-17 MED ORDER — CARVEDILOL 3.125 MG PO TABS
3.1250 mg | ORAL_TABLET | Freq: Two times a day (BID) | ORAL | Status: DC
  Administered 2021-03-17 – 2021-03-24 (×14): 3.125 mg via ORAL
  Filled 2021-03-17 (×14): qty 1

## 2021-03-17 MED ORDER — SUGAMMADEX SODIUM 200 MG/2ML IV SOLN
INTRAVENOUS | Status: DC | PRN
  Administered 2021-03-17: 200 mg via INTRAVENOUS

## 2021-03-17 MED ORDER — FENTANYL CITRATE (PF) 100 MCG/2ML IJ SOLN
INTRAMUSCULAR | Status: DC | PRN
  Administered 2021-03-17 (×2): 50 ug via INTRAVENOUS

## 2021-03-17 MED ORDER — CHLORHEXIDINE GLUCONATE 0.12 % MT SOLN
15.0000 mL | Freq: Once | OROMUCOSAL | Status: AC
  Administered 2021-03-17: 15 mL via OROMUCOSAL

## 2021-03-17 MED ORDER — OXYCODONE HCL 5 MG PO TABS: 5.0000 mg | ORAL_TABLET | Freq: Once | ORAL | Status: DC | PRN

## 2021-03-17 MED ORDER — HYDROMORPHONE HCL 1 MG/ML IJ SOLN
0.5000 mg | INTRAMUSCULAR | Status: DC | PRN
  Administered 2021-03-17 – 2021-03-19 (×6): 1 mg via INTRAVENOUS
  Filled 2021-03-17 (×6): qty 1

## 2021-03-17 MED ORDER — LABETALOL HCL 5 MG/ML IV SOLN: 5.0000 mg | INTRAVENOUS | Status: DC | PRN

## 2021-03-17 MED ORDER — HYDROMORPHONE HCL 1 MG/ML IJ SOLN
1.0000 mg | INTRAMUSCULAR | Status: DC | PRN
  Filled 2021-03-17: qty 1

## 2021-03-17 MED ORDER — BUPIVACAINE-EPINEPHRINE (PF) 0.25% -1:200000 IJ SOLN
INTRAMUSCULAR | Status: DC | PRN
  Administered 2021-03-17: 30 mL

## 2021-03-17 MED ORDER — ACETAMINOPHEN 500 MG PO TABS: 1000.0000 mg | ORAL_TABLET | Freq: Three times a day (TID) | ORAL | Status: DC | PRN

## 2021-03-17 MED ORDER — ONDANSETRON HCL 4 MG/2ML IJ SOLN: 4.0000 mg | Freq: Once | INTRAMUSCULAR | Status: DC | PRN

## 2021-03-17 MED ORDER — FOLIC ACID 1 MG PO TABS
1.0000 mg | ORAL_TABLET | Freq: Every day | ORAL | Status: DC
  Administered 2021-03-17 – 2021-03-24 (×6): 1 mg via ORAL
  Filled 2021-03-17 (×7): qty 1

## 2021-03-17 MED ORDER — HYDROMORPHONE HCL 1 MG/ML IJ SOLN
INTRAMUSCULAR | Status: AC
  Administered 2021-03-17: 0.5 mg via INTRAVENOUS
  Filled 2021-03-17: qty 1

## 2021-03-17 MED ORDER — ADULT MULTIVITAMIN W/MINERALS CH
1.0000 | ORAL_TABLET | Freq: Every day | ORAL | Status: DC
  Administered 2021-03-17 – 2021-03-24 (×6): 1 via ORAL
  Filled 2021-03-17 (×8): qty 1

## 2021-03-17 MED ORDER — ONDANSETRON HCL 4 MG/2ML IJ SOLN
INTRAMUSCULAR | Status: DC | PRN
  Administered 2021-03-17: 4 mg via INTRAVENOUS

## 2021-03-17 MED ORDER — LORAZEPAM 2 MG/ML IJ SOLN
1.0000 mg | INTRAMUSCULAR | Status: AC | PRN
Start: 2021-03-17 — End: 2021-03-20
  Administered 2021-03-20 (×2): 1 mg via INTRAVENOUS
  Filled 2021-03-17 (×3): qty 1

## 2021-03-17 MED ORDER — PROPOFOL 10 MG/ML IV BOLUS
INTRAVENOUS | Status: DC | PRN
  Administered 2021-03-17: 140 mg via INTRAVENOUS

## 2021-03-17 MED ORDER — LACTATED RINGERS IV SOLN: INTRAVENOUS | Status: DC

## 2021-03-17 MED ORDER — HYDROMORPHONE HCL 1 MG/ML IJ SOLN
0.5000 mg | INTRAMUSCULAR | Status: DC | PRN
  Administered 2021-03-17: 0.5 mg via INTRAVENOUS
  Filled 2021-03-17: qty 0.5

## 2021-03-17 MED ORDER — SODIUM CHLORIDE 0.9 % IR SOLN
Status: DC | PRN
  Administered 2021-03-17: 1000 mL

## 2021-03-17 MED ORDER — METRONIDAZOLE 500 MG/100ML IV SOLN
500.0000 mg | Freq: Three times a day (TID) | INTRAVENOUS | Status: DC
  Administered 2021-03-17: 500 mg via INTRAVENOUS
  Filled 2021-03-17 (×2): qty 100

## 2021-03-17 MED ORDER — ONDANSETRON 4 MG PO TBDP
4.0000 mg | ORAL_TABLET | Freq: Three times a day (TID) | ORAL | Status: DC | PRN
  Administered 2021-03-18: 4 mg via ORAL
  Filled 2021-03-17: qty 1

## 2021-03-17 MED ORDER — PROPOFOL 10 MG/ML IV BOLUS
INTRAVENOUS | Status: AC
  Filled 2021-03-17: qty 20

## 2021-03-17 MED ORDER — ROCURONIUM BROMIDE 10 MG/ML (PF) SYRINGE
PREFILLED_SYRINGE | INTRAVENOUS | Status: DC | PRN
  Administered 2021-03-17: 50 mg via INTRAVENOUS

## 2021-03-17 MED ORDER — POLYETHYLENE GLYCOL 3350 17 G PO PACK: 17.0000 g | PACK | Freq: Every day | ORAL | Status: DC | PRN

## 2021-03-17 MED ORDER — ONDANSETRON HCL 4 MG/2ML IJ SOLN
INTRAMUSCULAR | Status: AC
  Filled 2021-03-17: qty 2

## 2021-03-17 MED ORDER — THIAMINE HCL 100 MG PO TABS
100.0000 mg | ORAL_TABLET | Freq: Every day | ORAL | Status: DC
  Administered 2021-03-17 – 2021-03-24 (×7): 100 mg via ORAL
  Filled 2021-03-17 (×7): qty 1

## 2021-03-17 MED ORDER — HYDROMORPHONE HCL 1 MG/ML IJ SOLN
1.0000 mg | INTRAMUSCULAR | Status: DC | PRN
  Administered 2021-03-17: 1 mg via INTRAVENOUS
  Filled 2021-03-17: qty 1

## 2021-03-17 MED ORDER — LIDOCAINE 2% (20 MG/ML) 5 ML SYRINGE
INTRAMUSCULAR | Status: DC | PRN
  Administered 2021-03-17: 40 mg via INTRAVENOUS

## 2021-03-17 MED ORDER — HYDROMORPHONE HCL 1 MG/ML IJ SOLN
0.5000 mg | INTRAMUSCULAR | Status: AC
Start: 2021-03-17 — End: 2021-03-17
  Administered 2021-03-17: 0.5 mg via INTRAVENOUS

## 2021-03-17 MED ORDER — LIDOCAINE 2% (20 MG/ML) 5 ML SYRINGE
INTRAMUSCULAR | Status: AC
  Filled 2021-03-17: qty 5

## 2021-03-17 MED ORDER — DEXAMETHASONE SODIUM PHOSPHATE 10 MG/ML IJ SOLN
INTRAMUSCULAR | Status: DC | PRN
  Administered 2021-03-17: 8 mg via INTRAVENOUS

## 2021-03-17 MED ORDER — BUPIVACAINE-EPINEPHRINE (PF) 0.25% -1:200000 IJ SOLN
INTRAMUSCULAR | Status: AC
  Filled 2021-03-17: qty 30

## 2021-03-17 MED ORDER — PHENYLEPHRINE HCL-NACL 10-0.9 MG/250ML-% IV SOLN
INTRAVENOUS | Status: DC | PRN
  Administered 2021-03-17: 40 ug/min via INTRAVENOUS

## 2021-03-17 MED ORDER — DEXAMETHASONE SODIUM PHOSPHATE 10 MG/ML IJ SOLN
INTRAMUSCULAR | Status: AC
  Filled 2021-03-17: qty 1

## 2021-03-17 MED ORDER — ROCURONIUM BROMIDE 10 MG/ML (PF) SYRINGE
PREFILLED_SYRINGE | INTRAVENOUS | Status: AC
  Filled 2021-03-17: qty 10

## 2021-03-17 MED ORDER — LORAZEPAM 1 MG PO TABS
1.0000 mg | ORAL_TABLET | ORAL | Status: AC | PRN
Start: 2021-03-17 — End: 2021-03-20

## 2021-03-17 MED ORDER — LACTATED RINGERS IV SOLN
INTRAVENOUS | Status: AC | PRN
  Administered 2021-03-17: 1000 mL

## 2021-03-17 MED ORDER — MIDAZOLAM HCL 2 MG/2ML IJ SOLN
INTRAMUSCULAR | Status: AC
  Filled 2021-03-17: qty 2

## 2021-03-17 MED ORDER — LABETALOL HCL 5 MG/ML IV SOLN
5.0000 mg | INTRAVENOUS | Status: DC | PRN
  Administered 2021-03-20: 5 mg via INTRAVENOUS
  Filled 2021-03-17: qty 4

## 2021-03-17 MED ORDER — DOCUSATE SODIUM 100 MG PO CAPS
100.0000 mg | ORAL_CAPSULE | Freq: Two times a day (BID) | ORAL | Status: DC
  Administered 2021-03-17 – 2021-03-24 (×11): 100 mg via ORAL
  Filled 2021-03-17 (×13): qty 1

## 2021-03-17 MED ORDER — PHENYLEPHRINE 40 MCG/ML (10ML) SYRINGE FOR IV PUSH (FOR BLOOD PRESSURE SUPPORT)
PREFILLED_SYRINGE | INTRAVENOUS | Status: DC | PRN
  Administered 2021-03-17: 120 ug via INTRAVENOUS
  Administered 2021-03-17 (×2): 80 ug via INTRAVENOUS

## 2021-03-17 MED ORDER — PNEUMOCOCCAL VAC POLYVALENT 25 MCG/0.5ML IJ INJ
0.5000 mL | INJECTION | INTRAMUSCULAR | Status: DC
  Filled 2021-03-17: qty 0.5

## 2021-03-17 MED ORDER — FENTANYL CITRATE (PF) 100 MCG/2ML IJ SOLN
INTRAMUSCULAR | Status: AC
  Filled 2021-03-17: qty 2

## 2021-03-17 SURGICAL SUPPLY — 45 items
APPLIER CLIP 5 13 M/L LIGAMAX5 (MISCELLANEOUS)
APPLIER CLIP ROT 10 11.4 M/L (STAPLE)
CABLE HIGH FREQUENCY MONO STRZ (ELECTRODE) ×2 IMPLANT
CHLORAPREP W/TINT 26 (MISCELLANEOUS) ×2 IMPLANT
CLIP APPLIE 5 13 M/L LIGAMAX5 (MISCELLANEOUS) IMPLANT
CLIP APPLIE ROT 10 11.4 M/L (STAPLE) IMPLANT
COVER WAND RF STERILE (DRAPES) ×2 IMPLANT
CUTTER ECHEON FLEX ENDO 45 340 (ENDOMECHANICALS) ×2 IMPLANT
CUTTER FLEX LINEAR 45M (STAPLE) ×2 IMPLANT
DECANTER SPIKE VIAL GLASS SM (MISCELLANEOUS) ×2 IMPLANT
DERMABOND ADVANCED (GAUZE/BANDAGES/DRESSINGS) ×1
DERMABOND ADVANCED .7 DNX12 (GAUZE/BANDAGES/DRESSINGS) ×1 IMPLANT
DRAIN CHANNEL 19F RND (DRAIN) IMPLANT
ELECT REM PT RETURN 15FT ADLT (MISCELLANEOUS) ×2 IMPLANT
ENDOLOOP SUT PDS II  0 18 (SUTURE)
ENDOLOOP SUT PDS II 0 18 (SUTURE) IMPLANT
EVACUATOR SILICONE 100CC (DRAIN) IMPLANT
GLOVE SURG ENC MOIS LTX SZ7.5 (GLOVE) ×2 IMPLANT
GLOVE SURG UNDER LTX SZ8 (GLOVE) ×2 IMPLANT
GOWN STRL REUS W/TWL XL LVL3 (GOWN DISPOSABLE) ×4 IMPLANT
GRASPER SUT TROCAR 14GX15 (MISCELLANEOUS) ×2 IMPLANT
IRRIG SUCT STRYKERFLOW 2 WTIP (MISCELLANEOUS) ×2
IRRIGATION SUCT STRKRFLW 2 WTP (MISCELLANEOUS) ×1 IMPLANT
KIT BASIN OR (CUSTOM PROCEDURE TRAY) ×2 IMPLANT
KIT TURNOVER KIT A (KITS) IMPLANT
NEEDLE INSUFFLATION 14GA 120MM (NEEDLE) ×2 IMPLANT
PENCIL SMOKE EVACUATOR (MISCELLANEOUS) ×2 IMPLANT
POUCH SPECIMEN RETRIEVAL 10MM (ENDOMECHANICALS) ×2 IMPLANT
RELOAD 45 VASCULAR/THIN (ENDOMECHANICALS) IMPLANT
RELOAD STAPLE TA45 3.5 REG BLU (ENDOMECHANICALS) IMPLANT
SCISSORS LAP 5X35 DISP (ENDOMECHANICALS) IMPLANT
SET IRRIG TUBING LAPAROSCOPIC (IRRIGATION / IRRIGATOR) ×2 IMPLANT
SET TUBE SMOKE EVAC HIGH FLOW (TUBING) ×2 IMPLANT
SHEARS HARMONIC ACE PLUS 36CM (ENDOMECHANICALS) ×2 IMPLANT
SLEEVE ADV FIXATION 5X100MM (TROCAR) ×2 IMPLANT
STAPLE RELOAD 45 WHT (STAPLE) ×1 IMPLANT
STAPLE RELOAD 45MM WHITE (STAPLE) ×2
SUT ETHILON 3 0 PS 1 (SUTURE) IMPLANT
SUT MNCRL AB 4-0 PS2 18 (SUTURE) ×2 IMPLANT
TOWEL OR 17X26 10 PK STRL BLUE (TOWEL DISPOSABLE) IMPLANT
TRAY FOLEY MTR SLVR 14FR STAT (SET/KITS/TRAYS/PACK) IMPLANT
TRAY FOLEY MTR SLVR 16FR STAT (SET/KITS/TRAYS/PACK) IMPLANT
TRAY LAPAROSCOPIC (CUSTOM PROCEDURE TRAY) ×2 IMPLANT
TROCAR ADV FIXATION 5X100MM (TROCAR) ×2 IMPLANT
TROCAR XCEL 12X100 BLDLESS (ENDOMECHANICALS) ×2 IMPLANT

## 2021-03-17 NOTE — Progress Notes (Signed)
Progress Note: General Surgery Service   Chief Complaint/Subjective: Ms. Tracy Fischer has ongoing severe right lower quadrant abdominal pain this morning.  She is anxious to have her appendix removed.  Objective: Vital signs in last 24 hours: Temp:  [97.8 F (36.6 C)-98.9 F (37.2 C)] 98.9 F (37.2 C) (04/28 0832) Pulse Rate:  [79-110] 110 (04/28 0832) Resp:  [16-20] 20 (04/28 0832) BP: (123-201)/(62-109) 169/88 (04/28 0832) SpO2:  [97 %-100 %] 98 % (04/28 0832) Weight:  [59 kg] 59 kg (04/27 1937) Last BM Date: 03/17/21  Intake/Output from previous day: 04/27 0701 - 04/28 0700 In: 1360.1 [I.V.:260.1; IV Piggyback:1100] Out: -  Intake/Output this shift: No intake/output data recorded.  Constitutional: NAD; conversant; no deformities Eyes: Moist conjunctiva; no lid lag; anicteric; PERRL Neck: Trachea midline; no thyromegaly Lungs: Normal respiratory effort; no tactile fremitus CV: RRR; no palpable thrills; no pitting edema GI: Abd tender over McBurney's point; no palpable hepatosplenomegaly MSK: Normal range of motion of extremities; no clubbing/cyanosis Psychiatric: Appropriate affect; alert and oriented x3 Lymphatic: No palpable cervical or axillary lymphadenopathy  Lab Results: CBC  Recent Labs    03/16/21 2040 03/17/21 0542  WBC 14.6* 14.7*  HGB 14.4 12.9  HCT 42.2 38.5  PLT 259 238   BMET Recent Labs    03/16/21 2040 03/17/21 0542  NA 142 142  K 3.8 3.6  CL 104 109  CO2 24 25  GLUCOSE 116* 157*  BUN 11 8  CREATININE 0.98 0.82  CALCIUM 9.8 8.8*   PT/INR No results for input(s): LABPROT, INR in the last 72 hours. ABG No results for input(s): PHART, HCO3 in the last 72 hours.  Invalid input(s): PCO2, PO2  Anti-infectives: Anti-infectives (From admission, onward)   Start     Dose/Rate Route Frequency Ordered Stop   03/17/21 2300  cefTRIAXone (ROCEPHIN) 2 g in sodium chloride 0.9 % 100 mL IVPB        2 g 200 mL/hr over 30 Minutes Intravenous Every 24  hours 03/16/21 2322     03/17/21 0800  metroNIDAZOLE (FLAGYL) IVPB 500 mg        500 mg 100 mL/hr over 60 Minutes Intravenous Every 8 hours 03/16/21 2322     03/16/21 2245  cefTRIAXone (ROCEPHIN) 2 g in sodium chloride 0.9 % 100 mL IVPB       "And" Linked Group Details   2 g 200 mL/hr over 30 Minutes Intravenous  Once 03/16/21 2243 03/17/21 0009   03/16/21 2245  metroNIDAZOLE (FLAGYL) IVPB 500 mg       "And" Linked Group Details   500 mg 100 mL/hr over 60 Minutes Intravenous  Once 03/16/21 2243 03/17/21 0108      Medications: Scheduled Meds: . carvedilol  3.125 mg Oral BID WC  .  HYDROmorphone (DILAUDID) injection  1 mg Intravenous Once  . [START ON 03/18/2021] pneumococcal 23 valent vaccine  0.5 mL Intramuscular Tomorrow-1000  . thiamine injection  100 mg Intravenous Daily   Continuous Infusions: . cefTRIAXone (ROCEPHIN)  IV    . dextrose 5 % and 0.9% NaCl 75 mL/hr at 03/17/21 0129  . metronidazole 500 mg (03/17/21 0818)   PRN Meds:.HYDROmorphone (DILAUDID) injection, labetalol  Assessment/Plan: Ms. Tracy Fischer is a 55 year old female who presents with abdominal pain, found to have acute appendicitis on CT scan.    Acute appendicitis -The plan is for laparoscopic appendectomy today.  The procedure itself as well as its risk, benefits, and alternatives were discussed with the patient.  I discussed the use  of antibiotics for treatment of appendicitis, I explained that this is not a good idea in her case as she has an appendicolith.  After full discussion all questions answered the patient granted consent to proceed to the operating room.  We will proceed to the operating room as scheduled.   LOS: 0 days   FEN: Fluids ID: Rocephin VTE: CDs, Lovenox Foley: None Dispo: OR today    Felicie Morn, MD  [facility] Use [email or web address] to contact on call provider

## 2021-03-17 NOTE — Anesthesia Procedure Notes (Signed)
Procedure Name: Intubation Date/Time: 03/17/2021 3:28 PM Performed by: Montel Clock, CRNA Pre-anesthesia Checklist: Patient identified, Emergency Drugs available, Suction available, Patient being monitored and Timeout performed Patient Re-evaluated:Patient Re-evaluated prior to induction Oxygen Delivery Method: Circle system utilized Preoxygenation: Pre-oxygenation with 100% oxygen Induction Type: IV induction Ventilation: Mask ventilation without difficulty Laryngoscope Size: Mac and 3 Grade View: Grade II Tube type: Oral Tube size: 7.0 mm Number of attempts: 2 Airway Equipment and Method: Stylet Placement Confirmation: ETT inserted through vocal cords under direct vision,  positive ETCO2 and breath sounds checked- equal and bilateral Secured at: 21 cm Tube secured with: Tape Dental Injury: Teeth and Oropharynx as per pre-operative assessment  Comments: Not fully paralyzed on first attempt (difficulty maintaining view). 2nd attempt grade 2 view with cricoid and ETT easily passed.

## 2021-03-17 NOTE — Op Note (Signed)
Patient: Tracy Fischer ([birth year], [id number])  Date of Surgery: 03/17/21  Preoperative Diagnosis: Acute Appendicitis   Postoperative Diagnosis: Acute Appendicitis   Surgical Procedure: Laparoscopic appendectomy  Operative Team Members:  Surgeon(s) and Role:    * Baruc Tugwell, Nickola Major, MD - Primary   Anesthesiologist: Darral Dash, DO CRNA: Montel Clock, CRNA   Anesthesia: General   Fluids:  Total I/O In: -  Out: 10 [BOFBP:10]  Complications: None  Drains:  none   Specimen:  ID Type Source Tests Collected by Time Destination  1 : APPENDIX Tissue PATH Appendix SURGICAL PATHOLOGY Timouthy Gilardi, Nickola Major, MD 03/17/2021 1338      Disposition:  PACU - hemodynamically stable.  Plan of Care: Admit for overnight observation    Indications for Procedure: Tracy Fischer is a 55 y.o. female who presented with abdominal pain.  History, physical and imaging was concerning for appendicitis, so laparoscopic appendectomy was recommended for the patient.  The procedure itself, as well as the risks, benefits and alternatives were discussed with the patient.  Risks discussed included but were not limited to the risk of bleeding, infection, damage to nearby structures, need to convert to open procedure, incisional hernia, and the need for additional procedures or surgeries.  With this discussion complete and all questions answered the patient granted consent to proceed.  Findings:  Perforated appendicitis with purulent appendicitis Adhesions between the omentum an intestine and abdominal wall.  Small bowel densely adherent to the anterior abdominal wall at the previous c-section operative site was left in place and I worked around it.  Infection status: Patient: Tracy Fischer [facility] Service Patient Case: Emergent Infection Present At Time Of Surgery (PATOS): Purulent Peritonitis   Description of Procedure:   On the date stated above, the patient  was taken to the operating room suite and placed in supine positioning with the left arm tucked.  Sequential compression devices were placed on the lower extremities to prevent blood clots.  General endotracheal anesthesia was induced.  The patient urinated just prior to surgery so a foley catheter was not placed.  Preoperative antibiotics (Ceftriaxone/Flagyl) were given within on a scheduled basis prior to surgery.  The patient's abdomen was prepped and draped in the usual sterile fashion.  A time-out was completed verifying the correct patient, procedure, positioning and equipment needed for the case.  We began by anesthetizing the skin with local anesthetic and then making a 5 mm incision just above the umbilicus.  We dissected through the subcutaneous tissues to the fascia.  The fascia was grasped and elevated using a Kocher clamp.  A 7mm incision was created in the fascia and a 40mm trocar was inserted into the abdomen.  The abdomen was insufflated to 15 mmHg.  A 5 mm trocar was inserted in this position under optical guidance and then the abdomen was inspected.  There was no trauma to the underlying viscera with initial trocar placement.  There were dense adhesions including a loop of small intestine which seemed incorporated in the patient's previous c-section site.  Two additional trocars were placed, one 5 mm trocar suprapubically and one 12 mm trocar in the left lower quadrant.  These were placed under direct vision without any trauma to the underlying viscera.    The patient was then placed in head down, left side down positioning.  The appendix was identified and dissected free from its attachments to the abdominal wall, small intestine and cecum.  A window was created in  the mesoappendix using blunt dissection.  We used a white 45 mm echelon linear stapler to divide the base of the appendix.  The harmonic was then used to divide the mesoappendix.  The appendix was placed in an endocatch bag and  removed through the 12 mm port site in the left lower quadrant.  The operative field was dry on final inspection. We used 4 L of irrigation to wash out the abdomen due to the purulent peritonitis.  The staple line was well formed.  There was good hemostasis at the end of the case.  At this point we directed our attention to closure.  The patient was moved back to a level position.  The 12 mm trocar site was closed at the fascial level using an 0-vicryl on a fascial suture passer.  The abdomen was desufflated.  The skin was closed using 4-0 Monocryl and dermabond.  All sponge and needle counts were correct at the end of the case.    Louanna Raw, MD General, Bariatric, & Minimally Invasive Surgery [facility], [place]

## 2021-03-17 NOTE — Progress Notes (Signed)
Patient to OR at this time

## 2021-03-17 NOTE — Transfer of Care (Signed)
Immediate Anesthesia Transfer of Care Note  Patient: Tracy Fischer  Procedure(s) Performed: APPENDECTOMY LAPAROSCOPIC (N/A )  Patient Location: PACU  Anesthesia Type:General  Level of Consciousness: drowsy and patient cooperative  Airway & Oxygen Therapy: Patient Spontanous Breathing and Patient connected to face mask oxygen  Post-op Assessment: Report given to RN and Post -op Vital signs reviewed and stable  Post vital signs: Reviewed and stable  Last Vitals:  Vitals Value Taken Time  BP 135/81 03/17/21 1626  Temp    Pulse 105 03/17/21 1629  Resp 27 03/17/21 1629  SpO2 100 % 03/17/21 1629  Vitals shown include unvalidated device data.  Last Pain:  Vitals:   03/17/21 1340  TempSrc:   PainSc: Asleep      Patients Stated Pain Goal: 9 (14/43/15 4008)  Complications: No complications documented.

## 2021-03-17 NOTE — Progress Notes (Signed)
[facility] CONSULT PROGRESS NOTE    Progress Note  Tracy Fischer  UMP:[id number] DOB: January 04, 1966 DOA: 03/16/2021 PCP: Merryl Hacker, No     Brief Narrative:   Tracy Fischer is an 55 y.o. female past medical history of essential hypertension noncompliant with her medication.  CT scan of the abdomen and pelvis showed keeping the site of surgery was consulted, and we are consulted for high blood pressure management.   Assessment/Plan:   Acute appendicitis CT scan of the abdomen pelvis showed acute appendicitis she was started empirically on IV fluids and antibiotics placed n.p.o. Further management per surgery.  Hypertensive urgency Blood pressure is controlled she has been noncompliant with her medication she does not know what medication she is at home. Started on low-dose Coreg. Will use labetalol instead of hydralazine as it would help with her heart rate 2. She agreed to proceed with surgery she knows the risk and benefits.  She is low to moderate risk for cardiopulmonary complications. We will continue to follow along with you.  DVT prophylaxis: lovenxo Family Communication:none Status is: Observation  The patient will require care spanning > 2 midnights and should be moved to inpatient because: Hemodynamically unstable  Dispo: The patient is from: Home              Anticipated d/c is to: Home              Patient currently is not medically stable to d/c.   Difficult to place patient No        Code Status:     Code Status Orders  (From admission, onward)         Start     Ordered   03/16/21 2320  Full code  Continuous        03/16/21 2321        Code Status History    This patient has a current code status but no historical code status.   Advance Care Planning Activity        IV Access:    Peripheral IV   Procedures and diagnostic studies:   CT ABDOMEN PELVIS W CONTRAST  Result Date: 03/16/2021 CLINICAL DATA:  Abdominal distension.  Pain. Patient reports no bowel movement for 3-4 days. Vomiting today. EXAM: CT ABDOMEN AND PELVIS WITH CONTRAST TECHNIQUE: Multidetector CT imaging of the abdomen and pelvis was performed using the standard protocol following bolus administration of intravenous contrast. CONTRAST:  151mL OMNIPAQUE IOHEXOL 300 MG/ML  SOLN COMPARISON:  Radiograph earlier today. FINDINGS: Lower chest: The lung bases are clear. No pleural fluid or acute airspace disease. Hepatobiliary: No focal liver abnormality is seen. No gallstones, gallbladder wall thickening, or biliary dilatation. Pancreas: Unremarkable. No pancreatic ductal dilatation or surrounding inflammatory changes. Spleen: Normal in size without focal abnormality. Adrenals/Urinary Tract: Normal adrenal glands. No hydronephrosis or perinephric edema. Homogeneous renal enhancement with symmetric excretion on delayed phase imaging. No evidence of renal calculi or focal lesion. Urinary bladder is partially distended without wall thickening. Stomach/Bowel: Acute appendicitis as described below. Unremarkable stomach. Proximal small bowel loops are decompressed. Distal small bowel loops are fluid-filled and mildly prominent, maximal dimension 2.5 cm, likely ileus. Mixed liquid and solid stool throughout the colon without colonic wall thickening or inflammation. Appendix: Location: Medial to the cecum. Diameter: 15 mm Appendicolith: Present Mucosal hyper-enhancement: Mild Extraluminal gas: No Periappendiceal collection: No. Periappendiceal fat stranding but no collection or free air. Vascular/Lymphatic: Normal caliber abdominal aorta. Patent portal vein. Few prominent ileocolic nodes are  likely reactive. Reproductive: Uterus courses into the left pelvis where it is lobulated, possible fibroids. No evidence of adnexal mass. Other: Tiny fat containing umbilical hernia. No free air, ascites, or intra-abdominal fluid collection. Musculoskeletal: Mild degenerative change of both  sacroiliac joints. There are no acute or suspicious osseous abnormalities. IMPRESSION: Uncomplicated acute appendicitis. Electronically Signed   By: Keith Rake M.D.   On: 03/16/2021 22:22   DG ABD ACUTE 2+V W 1V CHEST  Result Date: 03/16/2021 CLINICAL DATA:  55 year old female with abdominal pain. EXAM: DG ABDOMEN ACUTE WITH 1 VIEW CHEST COMPARISON:  None. FINDINGS: The lungs are clear. There is no pleural effusion pneumothorax. The cardiac silhouette is within limits. There is no bowel dilatation or evidence of obstruction. No free air or radiopaque calculi. The osseous structures are unremarkable. IMPRESSION: Negative abdominal radiographs. No acute cardiopulmonary disease. Electronically Signed   By: Anner Crete M.D.   On: 03/16/2021 21:17     Medical Consultants:    None.   Subjective:    Tracy Fischer she relates her pain is well controlled.  Objective:    Vitals:   03/17/21 0000 03/17/21 0024 03/17/21 0123 03/17/21 0423  BP: (!) 159/79 (!) 173/99 125/62 123/79  Pulse: 93 96  (!) 101  Resp: 18 18  19   Temp:  98 F (36.7 C)  98.7 F (37.1 C)  TempSrc:  Oral  Oral  SpO2: 98% 100%  100%  Weight:      Height:       SpO2: 100 %   Intake/Output Summary (Last 24 hours) at 03/17/2021 0724 Last data filed at 03/17/2021 0500 Gross per 24 hour  Intake 1360.11 ml  Output --  Net 1360.11 ml   Filed Weights   03/16/21 1937  Weight: 59 kg    Exam: General exam: In no acute distress. Respiratory system: Good air movement and clear to auscultation. Cardiovascular system: S1 & S2 heard, RRR. No JVD. Gastrointestinal system: Abdomen is nondistended, bilateral lower quadrants pain more predominant than the left.  With some rebound. Extremities: No pedal edema. Skin: No rashes, lesions or ulcers  Data Reviewed:    Labs: Basic Metabolic Panel: Recent Labs  Lab 03/16/21 2040 03/17/21 0542  NA 142 142  K 3.8 3.6  CL 104 109  CO2 24 25  GLUCOSE 116*  157*  BUN 11 8  CREATININE 0.98 0.82  CALCIUM 9.8 8.8*   GFR Estimated Creatinine Clearance: 70.6 mL/min (by C-G formula based on SCr of 0.82 mg/dL). Liver Function Tests: Recent Labs  Lab 03/16/21 2040  AST 22  ALT 22  ALKPHOS 82  BILITOT 0.9  PROT 7.8  ALBUMIN 4.4   Recent Labs  Lab 03/16/21 2040  LIPASE 38   No results for input(s): AMMONIA in the last 168 hours. Coagulation profile No results for input(s): INR, PROTIME in the last 168 hours. COVID-19 Labs  No results for input(s): DDIMER, FERRITIN, LDH, CRP in the last 72 hours.  Lab Results  Component Value Date   SARSCOV2NAA NEGATIVE 03/16/2021   [place] Not Detected 09/19/2019    CBC: Recent Labs  Lab 03/16/21 2040 03/17/21 0542  WBC 14.6* 14.7*  NEUTROABS 12.1*  --   HGB 14.4 12.9  HCT 42.2 38.5  MCV 94.0 97.2  PLT 259 238   Cardiac Enzymes: No results for input(s): CKTOTAL, CKMB, CKMBINDEX, TROPONINI in the last 168 hours. BNP (last 3 results) No results for input(s): PROBNP in the last 8760 hours. CBG: Recent Labs  Lab 03/17/21 0026 03/17/21 0703  GLUCAP 117* 173*   D-Dimer: No results for input(s): DDIMER in the last 72 hours. Hgb A1c: No results for input(s): HGBA1C in the last 72 hours. Lipid Profile: No results for input(s): CHOL, HDL, LDLCALC, TRIG, CHOLHDL, LDLDIRECT in the last 72 hours. Thyroid function studies: No results for input(s): TSH, T4TOTAL, T3FREE, THYROIDAB in the last 72 hours.  Invalid input(s): FREET3 Anemia work up: No results for input(s): VITAMINB12, FOLATE, FERRITIN, TIBC, IRON, RETICCTPCT in the last 72 hours. Sepsis Labs: Recent Labs  Lab 03/16/21 2040 03/17/21 0542  WBC 14.6* 14.7*   Microbiology Recent Results (from the past 240 hour(s))  Resp Panel by RT-PCR (Flu A&B, Covid) Nasopharyngeal Swab     Status: None   Collection Time: 03/16/21 10:37 PM   Specimen: Nasopharyngeal Swab; Nasopharyngeal(NP) swabs in vial transport medium  Result  Value Ref Range Status   SARS Coronavirus 2 by RT PCR NEGATIVE NEGATIVE Final    Comment: (NOTE) SARS-CoV-2 target nucleic acids are NOT DETECTED.  The SARS-CoV-2 RNA is generally detectable in upper respiratory specimens during the acute phase of infection. The lowest concentration of SARS-CoV-2 viral copies this assay can detect is 138 copies/mL. A negative result does not preclude SARS-Cov-2 infection and should not be used as the sole basis for treatment or other patient management decisions. A negative result may occur with  improper specimen collection/handling, submission of specimen other than nasopharyngeal swab, presence of viral mutation(s) within the areas targeted by this assay, and inadequate number of viral copies(<138 copies/mL). A negative result must be combined with clinical observations, patient history, and epidemiological information. The expected result is Negative.  Fact Sheet for Patients:  EntrepreneurPulse.com.au  Fact Sheet for Healthcare Providers:  IncredibleEmployment.be  This test is no t yet approved or cleared by the [place] FDA and  has been authorized for detection and/or diagnosis of SARS-CoV-2 by FDA under an Emergency Use Authorization (EUA). This EUA will remain  in effect (meaning this test can be used) for the duration of the COVID-19 declaration under Section 564(b)(1) of the Act, 21 U.S.C.section 360bbb-3(b)(1), unless the authorization is terminated  or revoked sooner.       Influenza A by PCR NEGATIVE NEGATIVE Final   Influenza B by PCR NEGATIVE NEGATIVE Final    Comment: (NOTE) The Xpert Xpress SARS-CoV-2/FLU/RSV plus assay is intended as an aid in the diagnosis of influenza from Nasopharyngeal swab specimens and should not be used as a sole basis for treatment. Nasal washings and aspirates are unacceptable for Xpert Xpress SARS-CoV-2/FLU/RSV testing.  Fact Sheet for  Patients: EntrepreneurPulse.com.au  Fact Sheet for Healthcare Providers: IncredibleEmployment.be  This test is not yet approved or cleared by the [place] FDA and has been authorized for detection and/or diagnosis of SARS-CoV-2 by FDA under an Emergency Use Authorization (EUA). This EUA will remain in effect (meaning this test can be used) for the duration of the COVID-19 declaration under Section 564(b)(1) of the Act, 21 U.S.C. section 360bbb-3(b)(1), unless the authorization is terminated or revoked.  Performed at [facility], [place] [address], [place], [place] [address]      Medications:   . [START ON 03/18/2021] pneumococcal 23 valent vaccine  0.5 mL Intramuscular Tomorrow-1000  . thiamine injection  100 mg Intravenous Daily   Continuous Infusions: . cefTRIAXone (ROCEPHIN)  IV    . dextrose 5 % and 0.9% NaCl 75 mL/hr at 03/17/21 0129  . metronidazole  LOS: 0 days   Charlynne Cousins  [facility]  03/17/2021, 7:24 AM

## 2021-03-17 NOTE — Progress Notes (Signed)
Report received from floor nurse

## 2021-03-17 NOTE — Anesthesia Preprocedure Evaluation (Signed)
Anesthesia Evaluation  Patient identified by MRN, date of birth, ID band Patient awake    Reviewed: Allergy & Precautions, NPO status , Patient's Chart, lab work & pertinent test results  Airway Mallampati: II  TM Distance: >3 FB Neck ROM: Full    Dental no notable dental hx. (+) Dental Advisory Given, Caps   Pulmonary Current Smoker and Patient abstained from smoking.,    Pulmonary exam normal breath sounds clear to auscultation       Cardiovascular hypertension,  Rhythm:Regular Rate:Tachycardia     Neuro/Psych negative neurological ROS  negative psych ROS   GI/Hepatic Neg liver ROS, Acute appendicitis   Endo/Other  negative endocrine ROS  Renal/GU negative Renal ROS  negative genitourinary   Musculoskeletal negative musculoskeletal ROS (+)   Abdominal (+)  Abdomen: tender.    Peds  Hematology negative hematology ROS (+)   Anesthesia Other Findings   Reproductive/Obstetrics                             Anesthesia Physical Anesthesia Plan  ASA: II  Anesthesia Plan: General   Post-op Pain Management:    Induction:   PONV Risk Score and Plan: 3 and Treatment may vary due to age or medical condition, Midazolam and Ondansetron  Airway Management Planned: Oral ETT  Additional Equipment:   Intra-op Plan:   Post-operative Plan: Extubation in OR  Informed Consent: I have reviewed the patients History and Physical, chart, labs and discussed the procedure including the risks, benefits and alternatives for the proposed anesthesia with the patient or authorized representative who has indicated his/her understanding and acceptance.     Dental advisory given  Plan Discussed with: CRNA and Anesthesiologist  Anesthesia Plan Comments:         Anesthesia Quick Evaluation

## 2021-03-17 NOTE — Progress Notes (Signed)
Patient returns from OR at this time 

## 2021-03-17 NOTE — Progress Notes (Signed)
Progressive abd pain noted Min relief noted from 0645 I mg morphine On call notified awaiting response

## 2021-03-17 NOTE — Anesthesia Postprocedure Evaluation (Signed)
Anesthesia Post Note  Patient: Tracy Fischer  Procedure(s) Performed: APPENDECTOMY LAPAROSCOPIC (N/A )     Patient location during evaluation: PACU Anesthesia Type: General Level of consciousness: awake and alert Pain management: pain level controlled Vital Signs Assessment: post-procedure vital signs reviewed and stable Respiratory status: spontaneous breathing, nonlabored ventilation, respiratory function stable and patient connected to nasal cannula oxygen Cardiovascular status: blood pressure returned to baseline and stable Postop Assessment: no apparent nausea or vomiting Anesthetic complications: no   No complications documented.  Last Vitals:  Vitals:   03/17/21 1730 03/17/21 1746  BP: (!) 128/92 (!) 153/93  Pulse: 94 99  Resp: (!) 22 20  Temp:  36.8 C  SpO2: 92% 97%    Last Pain:  Vitals:   03/17/21 1833  TempSrc:   PainSc: 3                  [name] [name]

## 2021-03-18 ENCOUNTER — Encounter (HOSPITAL_COMMUNITY): Payer: Self-pay | Admitting: Surgery

## 2021-03-18 LAB — BASIC METABOLIC PANEL
Anion gap: 8 (ref 5–15)
BUN: 10 mg/dL (ref 6–20)
CO2: 27 mmol/L (ref 22–32)
Calcium: 8.9 mg/dL (ref 8.9–10.3)
Chloride: 101 mmol/L (ref 98–111)
Creatinine, Ser: 0.88 mg/dL (ref 0.44–1.00)
GFR, Estimated: >60 mL/min (ref >60)
Glucose, Bld: 113 mg/dL — ABNORMAL HIGH (ref 70–99)
Potassium: 3.7 mmol/L (ref 3.5–5.1)
Sodium: 136 mmol/L (ref 135–145)

## 2021-03-18 LAB — CBC
HCT: 37.4 % (ref 36.0–46.0)
Hemoglobin: 12.3 g/dL (ref 12.0–15.0)
MCH: 32.1 pg (ref 26.0–34.0)
MCHC: 32.9 g/dL (ref 30.0–36.0)
MCV: 97.7 fL (ref 80.0–100.0)
Platelets: 221 10*3/uL (ref 150–400)
RBC: 3.83 MIL/uL — ABNORMAL LOW (ref 3.87–5.11)
RDW: 13.1 % (ref 11.5–15.5)
WBC: 10 10*3/uL (ref 4.0–10.5)
nRBC: 0 % (ref 0.0–0.2)

## 2021-03-18 LAB — GLUCOSE, CAPILLARY
Glucose-Capillary: 131 mg/dL — ABNORMAL HIGH (ref 70–99)
Glucose-Capillary: 119 mg/dL — ABNORMAL HIGH (ref 70–99)
Glucose-Capillary: 114 mg/dL — ABNORMAL HIGH (ref 70–99)

## 2021-03-18 MED ORDER — ENOXAPARIN SODIUM 40 MG/0.4ML IJ SOSY
40.0000 mg | PREFILLED_SYRINGE | INTRAMUSCULAR | Status: DC
  Administered 2021-03-18 – 2021-03-24 (×7): 40 mg via SUBCUTANEOUS
  Filled 2021-03-18 (×7): qty 0.4

## 2021-03-18 MED ORDER — PNEUMOCOCCAL VAC POLYVALENT 25 MCG/0.5ML IJ INJ
0.5000 mL | INJECTION | INTRAMUSCULAR | Status: DC
  Filled 2021-03-18: qty 0.5

## 2021-03-18 MED ORDER — ONDANSETRON HCL 4 MG/2ML IJ SOLN
4.0000 mg | Freq: Four times a day (QID) | INTRAMUSCULAR | Status: DC | PRN
  Administered 2021-03-18 – 2021-03-23 (×10): 4 mg via INTRAVENOUS
  Filled 2021-03-18 (×10): qty 2

## 2021-03-18 MED ORDER — SODIUM CHLORIDE 0.9 % IV SOLN
2.0000 g | INTRAVENOUS | Status: DC
  Administered 2021-03-18 – 2021-03-22 (×5): 2 g via INTRAVENOUS
  Filled 2021-03-18: qty 2
  Filled 2021-03-18 (×4): qty 20

## 2021-03-18 MED ORDER — METRONIDAZOLE 500 MG/100ML IV SOLN
500.0000 mg | Freq: Three times a day (TID) | INTRAVENOUS | Status: DC
  Administered 2021-03-18 – 2021-03-22 (×13): 500 mg via INTRAVENOUS
  Filled 2021-03-18 (×16): qty 100

## 2021-03-18 MED ORDER — METHOCARBAMOL 500 MG PO TABS
500.0000 mg | ORAL_TABLET | Freq: Four times a day (QID) | ORAL | Status: DC
  Administered 2021-03-18 (×2): 500 mg via ORAL
  Filled 2021-03-18 (×2): qty 1

## 2021-03-18 MED ORDER — ACETAMINOPHEN 500 MG PO TABS
1000.0000 mg | ORAL_TABLET | Freq: Three times a day (TID) | ORAL | Status: DC
  Administered 2021-03-18 (×2): 1000 mg via ORAL
  Filled 2021-03-18 (×2): qty 2

## 2021-03-18 NOTE — Progress Notes (Signed)
1 Day Post-Op  Subjective: CC: Patient reports some soreness of the lower abdomen, near her incisions that she describes as soreness and rates as a 3/10. She feels pain medications are helping to control this pain. She is tolerating cld this am. Did have some nausea earlier that has resolved. Some burping. No flatus or bm yet. Voiding without difficulty. Only has mobilized to the bedside commode since surgery.   Objective: Vital signs in last 24 hours: Temp:  [98.3 F (36.8 C)-99.4 F (37.4 C)] 99.3 F (37.4 C) (04/29 0531) Pulse Rate:  [94-104] 97 (04/29 0531) Resp:  [15-27] 18 (04/29 0531) BP: (114-161)/(72-104) 114/72 (04/29 0531) SpO2:  [91 %-100 %] 96 % (04/29 0531) Weight:  [59 kg] 59 kg (04/28 1333) Last BM Date: 03/17/21  Intake/Output from previous day: 04/28 0701 - 04/29 0700 In: 1933.2 [P.O.:240; I.V.:1693.2] Out: 10 [Blood:10] Intake/Output this shift: No intake/output data recorded.  PE: Gen:  Alert, NAD, pleasant HEENT: EOM's intact, pupils equal and round Card:  RRR, no M/G/R heard Pulm:  CTAB, no W/R/R, effort normal Abd: Soft, ND, mild tenderness over the lower abdominal incisions that appears appropriate in the post operative setting. Otherwise NT. No peritonitis. +BS. Incisions with glue intact appears well and are without drainage, bleeding, or signs of infection Ext:  No LE edema  Psych: A&Ox3  Skin: no rashes noted, warm and dry   Lab Results:  Recent Labs    03/17/21 0542 03/18/21 0513  WBC 14.7* 10.0  HGB 12.9 12.3  HCT 38.5 37.4  PLT 238 221   BMET Recent Labs    03/17/21 0542 03/18/21 0513  NA 142 136  K 3.6 3.7  CL 109 101  CO2 25 27  GLUCOSE 157* 113*  BUN 8 10  CREATININE 0.82 0.88  CALCIUM 8.8* 8.9   PT/INR No results for input(s): LABPROT, INR in the last 72 hours. CMP     Component Value Date/Time   NA 136 03/18/2021 0513   K 3.7 03/18/2021 0513   CL 101 03/18/2021 0513   CO2 27 03/18/2021 0513   GLUCOSE 113  (H) 03/18/2021 0513   BUN 10 03/18/2021 0513   CREATININE 0.88 03/18/2021 0513   CALCIUM 8.9 03/18/2021 0513   PROT 7.8 03/16/2021 2040   ALBUMIN 4.4 03/16/2021 2040   AST 22 03/16/2021 2040   ALT 22 03/16/2021 2040   ALKPHOS 82 03/16/2021 2040   BILITOT 0.9 03/16/2021 2040   GFRNONAA >60 03/18/2021 0513   GFRAA >60 06/13/2015 2224   Lipase     Component Value Date/Time   LIPASE 38 03/16/2021 2040       Studies/Results: CT ABDOMEN PELVIS W CONTRAST  Result Date: 03/16/2021 CLINICAL DATA:  Abdominal distension. Pain. Patient reports no bowel movement for 3-4 days. Vomiting today. EXAM: CT ABDOMEN AND PELVIS WITH CONTRAST TECHNIQUE: Multidetector CT imaging of the abdomen and pelvis was performed using the standard protocol following bolus administration of intravenous contrast. CONTRAST:  116mL OMNIPAQUE IOHEXOL 300 MG/ML  SOLN COMPARISON:  Radiograph earlier today. FINDINGS: Lower chest: The lung bases are clear. No pleural fluid or acute airspace disease. Hepatobiliary: No focal liver abnormality is seen. No gallstones, gallbladder wall thickening, or biliary dilatation. Pancreas: Unremarkable. No pancreatic ductal dilatation or surrounding inflammatory changes. Spleen: Normal in size without focal abnormality. Adrenals/Urinary Tract: Normal adrenal glands. No hydronephrosis or perinephric edema. Homogeneous renal enhancement with symmetric excretion on delayed phase imaging. No evidence of renal calculi or focal lesion. Urinary  bladder is partially distended without wall thickening. Stomach/Bowel: Acute appendicitis as described below. Unremarkable stomach. Proximal small bowel loops are decompressed. Distal small bowel loops are fluid-filled and mildly prominent, maximal dimension 2.5 cm, likely ileus. Mixed liquid and solid stool throughout the colon without colonic wall thickening or inflammation. Appendix: Location: Medial to the cecum. Diameter: 15 mm Appendicolith: Present Mucosal  hyper-enhancement: Mild Extraluminal gas: No Periappendiceal collection: No. Periappendiceal fat stranding but no collection or free air. Vascular/Lymphatic: Normal caliber abdominal aorta. Patent portal vein. Few prominent ileocolic nodes are likely reactive. Reproductive: Uterus courses into the left pelvis where it is lobulated, possible fibroids. No evidence of adnexal mass. Other: Tiny fat containing umbilical hernia. No free air, ascites, or intra-abdominal fluid collection. Musculoskeletal: Mild degenerative change of both sacroiliac joints. There are no acute or suspicious osseous abnormalities. IMPRESSION: Uncomplicated acute appendicitis. Electronically Signed   By: Keith Rake M.D.   On: 03/16/2021 22:22   DG ABD ACUTE 2+V W 1V CHEST  Result Date: 03/16/2021 CLINICAL DATA:  55 year old female with abdominal pain. EXAM: DG ABDOMEN ACUTE WITH 1 VIEW CHEST COMPARISON:  None. FINDINGS: The lungs are clear. There is no pleural effusion pneumothorax. The cardiac silhouette is within limits. There is no bowel dilatation or evidence of obstruction. No free air or radiopaque calculi. The osseous structures are unremarkable. IMPRESSION: Negative abdominal radiographs. No acute cardiopulmonary disease. Electronically Signed   By: Anner Crete M.D.   On: 03/16/2021 21:17    Anti-infectives: Anti-infectives (From admission, onward)   Start     Dose/Rate Route Frequency Ordered Stop   03/18/21 0830  cefTRIAXone (ROCEPHIN) 2 g in sodium chloride 0.9 % 100 mL IVPB        2 g 200 mL/hr over 30 Minutes Intravenous Every 24 hours 03/18/21 0738     03/18/21 0830  metroNIDAZOLE (FLAGYL) IVPB 500 mg        500 mg 100 mL/hr over 60 Minutes Intravenous Every 8 hours 03/18/21 0738     03/17/21 2300  cefTRIAXone (ROCEPHIN) 2 g in sodium chloride 0.9 % 100 mL IVPB  Status:  Discontinued        2 g 200 mL/hr over 30 Minutes Intravenous Every 24 hours 03/16/21 2322 03/17/21 1744   03/17/21 1600   metroNIDAZOLE (FLAGYL) IVPB 500 mg  Status:  Discontinued        500 mg 100 mL/hr over 60 Minutes Intravenous Every 8 hours 03/17/21 1103 03/17/21 1744   03/17/21 0800  metroNIDAZOLE (FLAGYL) IVPB 500 mg  Status:  Discontinued        500 mg 100 mL/hr over 60 Minutes Intravenous Every 8 hours 03/16/21 2322 03/17/21 1107   03/16/21 2245  cefTRIAXone (ROCEPHIN) 2 g in sodium chloride 0.9 % 100 mL IVPB       "And" Linked Group Details   2 g 200 mL/hr over 30 Minutes Intravenous  Once 03/16/21 2243 03/17/21 0009   03/16/21 2245  metroNIDAZOLE (FLAGYL) IVPB 500 mg       "And" Linked Group Details   500 mg 100 mL/hr over 60 Minutes Intravenous  Once 03/16/21 2243 03/17/21 0108       Assessment/Plan HTN - presented with HTN urgency. Appreciate TRH assistance. BP 114/72 on last vitals  ETOH use - CIWA. TOC consult  Hx of Cocaine use  Perforated appendicitis with purulent appendicitis S/p Laparoscopic Appendectomy - Dr. Thermon Leyland - 03/17/21 - POD #1 - Adv diet. She is at risk for ileus. Monitor closely -  Cont IV abx. Will discuss with MD duration - Wean IV pain medication. Encourage PO use. Schedule tylenol and robaxin.  - Mobilize - Pulm toilet  FEN - FLD, IVF VTE - SCDs, Lovenox  ID - Rocephin/Flagyl 4/27 >> WBC 10.0. Afebrile  Foley - None. Voiding Follow-Up - DOW     LOS: 1 day    Jillyn Ledger , [facility] 03/18/2021, 9:02 AM Please see Amion for pager number during day hours 7:00am-4:30pm

## 2021-03-19 LAB — GLUCOSE, CAPILLARY
Glucose-Capillary: 101 mg/dL — ABNORMAL HIGH (ref 70–99)
Glucose-Capillary: 104 mg/dL — ABNORMAL HIGH (ref 70–99)
Glucose-Capillary: 81 mg/dL (ref 70–99)
Glucose-Capillary: 92 mg/dL (ref 70–99)
Glucose-Capillary: 97 mg/dL (ref 70–99)

## 2021-03-19 MED ORDER — ACETAMINOPHEN 160 MG/5ML PO SOLN
650.0000 mg | Freq: Four times a day (QID) | ORAL | Status: DC
  Administered 2021-03-19 – 2021-03-24 (×6): 650 mg via ORAL
  Filled 2021-03-19 (×14): qty 20.3

## 2021-03-19 MED ORDER — HYDROMORPHONE HCL 1 MG/ML IJ SOLN
0.5000 mg | INTRAMUSCULAR | Status: DC | PRN
  Administered 2021-03-19 – 2021-03-20 (×5): 0.5 mg via INTRAVENOUS
  Filled 2021-03-19 (×5): qty 0.5

## 2021-03-19 MED ORDER — METHOCARBAMOL 1000 MG/10ML IJ SOLN
500.0000 mg | Freq: Four times a day (QID) | INTRAVENOUS | Status: DC | PRN
  Filled 2021-03-19 (×2): qty 5

## 2021-03-19 NOTE — Progress Notes (Signed)
PROGRESS NOTE    Tracy Fischer  CBJ:[id number] DOB: [birth year] DOA: 03/16/2021 PCP: Pcp, No    Brief Narrative: This 55 years old female with PMH significant for essential hypertension , noncompliant with her medications, EtOH abuse, history of cocaine use admitted for acute appendicitis.  Patient was found to have perforated appendicitis with purulent appendicitis.  Patient underwent laparoscopic appendicectomy on 4/28.  Postoperatively developed ileus.  Started on IV antibiotics.  We were consulted for blood pressure management.  Assessment & Plan:   Principal Problem:   Acute appendicitis Active Problems:   Hypertensive urgency  Acute appendicitis:  CT abdomen shows perforated appendicitis with purulent drainage. Patient being followed by general surgery,  underwent laparoscopy appendicectomy on 4/28. Continue IV antibiotics as per surgery.  Hypertensive urgency: Patient with history of hypertension,  noncompliance of medications. Blood pressure is now controlled.  Started on low-dose Coreg.  History of cocaine and alcohol abuse: Advised about quitting.   DVT prophylaxis:  Lovenox. Code Status:  Full code. Family Communication: No family at bed side. Disposition Plan:  Status is: Inpatient  Remains inpatient appropriate because:Inpatient level of care appropriate due to severity of illness   Dispo: The patient is from: Home              Anticipated d/c is to: Home              Patient currently is not medically stable to d/c.   Difficult to place patient No   Consultants:    None  Procedures:   Laparoscopic appendicectomy  Antimicrobials:   Anti-infectives (From admission, onward)   Start     Dose/Rate Route Frequency Ordered Stop   03/18/21 0830  cefTRIAXone (ROCEPHIN) 2 g in sodium chloride 0.9 % 100 mL IVPB        2 g 200 mL/hr over 30 Minutes Intravenous Every 24 hours 03/18/21 0738     03/18/21 0830  metroNIDAZOLE (FLAGYL) IVPB 500 mg         500 mg 100 mL/hr over 60 Minutes Intravenous Every 8 hours 03/18/21 0738     03/17/21 2300  cefTRIAXone (ROCEPHIN) 2 g in sodium chloride 0.9 % 100 mL IVPB  Status:  Discontinued        2 g 200 mL/hr over 30 Minutes Intravenous Every 24 hours 03/16/21 2322 03/17/21 1744   03/17/21 1600  metroNIDAZOLE (FLAGYL) IVPB 500 mg  Status:  Discontinued        500 mg 100 mL/hr over 60 Minutes Intravenous Every 8 hours 03/17/21 1103 03/17/21 1744   03/17/21 0800  metroNIDAZOLE (FLAGYL) IVPB 500 mg  Status:  Discontinued        500 mg 100 mL/hr over 60 Minutes Intravenous Every 8 hours 03/16/21 2322 03/17/21 1107   03/16/21 2245  cefTRIAXone (ROCEPHIN) 2 g in sodium chloride 0.9 % 100 mL IVPB       "And" Linked Group Details   2 g 200 mL/hr over 30 Minutes Intravenous  Once 03/16/21 2243 03/17/21 0009   03/16/21 2245  metroNIDAZOLE (FLAGYL) IVPB 500 mg       "And" Linked Group Details   500 mg 100 mL/hr over 60 Minutes Intravenous  Once 03/16/21 2243 03/17/21 0108     Subjective: Patient was seen and examined at bedside.  Overnight events noted.  Patient reports having abdominal soreness.  Blood pressure is improving  Objective: Vitals:   03/18/21 1352 03/18/21 2028 03/19/21 0554 03/19/21 1328  BP: 133/81 (!) 159/92 129/77 Marland Kitchen)  150/89  Pulse: 88 95 82 87  Resp: 17 17 17 15   Temp: 98 F (36.7 C) 99.9 F (37.7 C) 97.9 F (36.6 C) 97.9 F (36.6 C)  TempSrc: Oral Oral Oral Oral  SpO2: 97% 99% 97% 100%  Weight:      Height:        Intake/Output Summary (Last 24 hours) at 03/19/2021 1526 Last data filed at 03/19/2021 0900 Gross per 24 hour  Intake 1958.63 ml  Output --  Net 1958.63 ml   Filed Weights   03/16/21 1937 03/17/21 1333  Weight: 59 kg 59 kg    Examination:  General exam: Appears calm and comfortable, not in any acute distress. Respiratory system: Clear to auscultation. Respiratory effort normal. Cardiovascular system: S1 & S2 heard, RRR. No JVD, murmurs, rubs, gallops or  clicks. No pedal edema. Gastrointestinal system: Abdomen is mildly distended, soft and mildly tender. No organomegaly or masses felt. Normal bowel sounds heard. Central nervous system: Alert and oriented. No focal neurological deficits. Extremities: Symmetric 5 x 5 power. Skin: No rashes, lesions or ulcers Psychiatry: Judgement and insight appear normal. Mood & affect appropriate.     Data Reviewed: I have personally reviewed following labs and imaging studies  CBC: Recent Labs  Lab 03/16/21 2040 03/17/21 0542 03/18/21 0513  WBC 14.6* 14.7* 10.0  NEUTROABS 12.1*  --   --   HGB 14.4 12.9 12.3  HCT 42.2 38.5 37.4  MCV 94.0 97.2 97.7  PLT 259 238 448   Basic Metabolic Panel: Recent Labs  Lab 03/16/21 2040 03/17/21 0542 03/18/21 0513  NA 142 142 136  K 3.8 3.6 3.7  CL 104 109 101  CO2 24 25 27   GLUCOSE 116* 157* 113*  BUN 11 8 10   CREATININE 0.98 0.82 0.88  CALCIUM 9.8 8.8* 8.9   GFR: Estimated Creatinine Clearance: 65.8 mL/min (by C-G formula based on SCr of 0.88 mg/dL). Liver Function Tests: Recent Labs  Lab 03/16/21 2040  AST 22  ALT 22  ALKPHOS 82  BILITOT 0.9  PROT 7.8  ALBUMIN 4.4   Recent Labs  Lab 03/16/21 2040  LIPASE 38   No results for input(s): AMMONIA in the last 168 hours. Coagulation Profile: No results for input(s): INR, PROTIME in the last 168 hours. Cardiac Enzymes: No results for input(s): CKTOTAL, CKMB, CKMBINDEX, TROPONINI in the last 168 hours. BNP (last 3 results) No results for input(s): PROBNP in the last 8760 hours. HbA1C: No results for input(s): HGBA1C in the last 72 hours. CBG: Recent Labs  Lab 03/18/21 1158 03/18/21 1731 03/19/21 0027 03/19/21 0552 03/19/21 1141  GLUCAP 119* 114* 101* 97 92   Lipid Profile: No results for input(s): CHOL, HDL, LDLCALC, TRIG, CHOLHDL, LDLDIRECT in the last 72 hours. Thyroid Function Tests: No results for input(s): TSH, T4TOTAL, FREET4, T3FREE, THYROIDAB in the last 72  hours. Anemia Panel: No results for input(s): VITAMINB12, FOLATE, FERRITIN, TIBC, IRON, RETICCTPCT in the last 72 hours. Sepsis Labs: No results for input(s): PROCALCITON, LATICACIDVEN in the last 168 hours.  Recent Results (from the past 240 hour(s))  Resp Panel by RT-PCR (Flu A&B, Covid) Nasopharyngeal Swab     Status: None   Collection Time: 03/16/21 10:37 PM   Specimen: Nasopharyngeal Swab; Nasopharyngeal(NP) swabs in vial transport medium  Result Value Ref Range Status   SARS Coronavirus 2 by RT PCR NEGATIVE NEGATIVE Final    Comment: (NOTE) SARS-CoV-2 target nucleic acids are NOT DETECTED.  The SARS-CoV-2 RNA is generally detectable in  upper respiratory specimens during the acute phase of infection. The lowest concentration of SARS-CoV-2 viral copies this assay can detect is 138 copies/mL. A negative result does not preclude SARS-Cov-2 infection and should not be used as the sole basis for treatment or other patient management decisions. A negative result may occur with  improper specimen collection/handling, submission of specimen other than nasopharyngeal swab, presence of viral mutation(s) within the areas targeted by this assay, and inadequate number of viral copies(<138 copies/mL). A negative result must be combined with clinical observations, patient history, and epidemiological information. The expected result is Negative.  Fact Sheet for Patients:  EntrepreneurPulse.com.au  Fact Sheet for Healthcare Providers:  IncredibleEmployment.be  This test is no t yet approved or cleared by the [place] FDA and  has been authorized for detection and/or diagnosis of SARS-CoV-2 by FDA under an Emergency Use Authorization (EUA). This EUA will remain  in effect (meaning this test can be used) for the duration of the COVID-19 declaration under Section 564(b)(1) of the Act, 21 U.S.C.section 360bbb-3(b)(1), unless the authorization is  terminated  or revoked sooner.       Influenza A by PCR NEGATIVE NEGATIVE Final   Influenza B by PCR NEGATIVE NEGATIVE Final    Comment: (NOTE) The Xpert Xpress SARS-CoV-2/FLU/RSV plus assay is intended as an aid in the diagnosis of influenza from Nasopharyngeal swab specimens and should not be used as a sole basis for treatment. Nasal washings and aspirates are unacceptable for Xpert Xpress SARS-CoV-2/FLU/RSV testing.  Fact Sheet for Patients: EntrepreneurPulse.com.au  Fact Sheet for Healthcare Providers: IncredibleEmployment.be  This test is not yet approved or cleared by the [place] FDA and has been authorized for detection and/or diagnosis of SARS-CoV-2 by FDA under an Emergency Use Authorization (EUA). This EUA will remain in effect (meaning this test can be used) for the duration of the COVID-19 declaration under Section 564(b)(1) of the Act, 21 U.S.C. section 360bbb-3(b)(1), unless the authorization is terminated or revoked.  Performed at [facility], [place] [address], [place], [place] [address]    Radiology Studies: No results found.  Scheduled Meds: . acetaminophen (TYLENOL) oral liquid 160 mg/5 mL  650 mg Oral Q6H  . carvedilol  3.125 mg Oral BID WC  . docusate sodium  100 mg Oral BID  . enoxaparin (LOVENOX) injection  40 mg Subcutaneous Q24H  . folic acid  1 mg Oral Daily  . multivitamin with minerals  1 tablet Oral Daily  . pneumococcal 23 valent vaccine  0.5 mL Intramuscular Tomorrow-1000  . thiamine  100 mg Oral Daily   Or  . thiamine  100 mg Intravenous Daily   Continuous Infusions: . cefTRIAXone (ROCEPHIN)  IV 2 g (03/19/21 0917)  . lactated ringers 75 mL/hr at 03/19/21 0504  . methocarbamol (ROBAXIN) IV    . metronidazole 500 mg (03/19/21 0819)     LOS: 2 days    Time spent: 25 mins.    Shawna Clamp, MD [facility]   If 7PM-7AM, please contact night-coverage

## 2021-03-19 NOTE — Progress Notes (Signed)
2 Days Post-Op  Subjective: CC: Still w some nausea. Denies bowel function. PO meds exacerbate nausea.   Objective: Vital signs in last 24 hours: Temp:  [97.9 F (36.6 C)-99.9 F (37.7 C)] 97.9 F (36.6 C) (04/30 0554) Pulse Rate:  [82-95] 82 (04/30 0554) Resp:  [17] 17 (04/30 0554) BP: (129-159)/(77-92) 129/77 (04/30 0554) SpO2:  [97 %-99 %] 97 % (04/30 0554) Last BM Date: 03/17/21  Intake/Output from previous day: 04/29 0701 - 04/30 0700 In: 1714.6 [P.O.:354; I.V.:960.6; IV Piggyback:400] Out: -  Intake/Output this shift: No intake/output data recorded.  PE: Gen:  Alert, NAD, pleasant HEENT: EOM's intact, pupils equal and round Card:  RRR, no M/G/R heard Pulm:  CTAB, no W/R/R, effort normal Abd: Soft, mildly distended, mild tenderness over the lower abdominal incisions that appears appropriate in the post operative setting. Otherwise NT. No peritonitis. +BS. Incisions with glue intact appears well and are without drainage, bleeding, or signs of infection Ext:  No LE edema  Psych: A&Ox3  Skin: no rashes noted, warm and dry   Lab Results:  Recent Labs    03/17/21 0542 03/18/21 0513  WBC 14.7* 10.0  HGB 12.9 12.3  HCT 38.5 37.4  PLT 238 221   BMET Recent Labs    03/17/21 0542 03/18/21 0513  NA 142 136  K 3.6 3.7  CL 109 101  CO2 25 27  GLUCOSE 157* 113*  BUN 8 10  CREATININE 0.82 0.88  CALCIUM 8.8* 8.9   PT/INR No results for input(s): LABPROT, INR in the last 72 hours. CMP     Component Value Date/Time   NA 136 03/18/2021 0513   K 3.7 03/18/2021 0513   CL 101 03/18/2021 0513   CO2 27 03/18/2021 0513   GLUCOSE 113 (H) 03/18/2021 0513   BUN 10 03/18/2021 0513   CREATININE 0.88 03/18/2021 0513   CALCIUM 8.9 03/18/2021 0513   PROT 7.8 03/16/2021 2040   ALBUMIN 4.4 03/16/2021 2040   AST 22 03/16/2021 2040   ALT 22 03/16/2021 2040   ALKPHOS 82 03/16/2021 2040   BILITOT 0.9 03/16/2021 2040   GFRNONAA >60 03/18/2021 0513   GFRAA >60  06/13/2015 2224   Lipase     Component Value Date/Time   LIPASE 38 03/16/2021 2040       Studies/Results: No results found.  Anti-infectives: Anti-infectives (From admission, onward)   Start     Dose/Rate Route Frequency Ordered Stop   03/18/21 0830  cefTRIAXone (ROCEPHIN) 2 g in sodium chloride 0.9 % 100 mL IVPB        2 g 200 mL/hr over 30 Minutes Intravenous Every 24 hours 03/18/21 0738     03/18/21 0830  metroNIDAZOLE (FLAGYL) IVPB 500 mg        500 mg 100 mL/hr over 60 Minutes Intravenous Every 8 hours 03/18/21 0738     03/17/21 2300  cefTRIAXone (ROCEPHIN) 2 g in sodium chloride 0.9 % 100 mL IVPB  Status:  Discontinued        2 g 200 mL/hr over 30 Minutes Intravenous Every 24 hours 03/16/21 2322 03/17/21 1744   03/17/21 1600  metroNIDAZOLE (FLAGYL) IVPB 500 mg  Status:  Discontinued        500 mg 100 mL/hr over 60 Minutes Intravenous Every 8 hours 03/17/21 1103 03/17/21 1744   03/17/21 0800  metroNIDAZOLE (FLAGYL) IVPB 500 mg  Status:  Discontinued        500 mg 100 mL/hr over 60 Minutes Intravenous Every  8 hours 03/16/21 2322 03/17/21 1107   03/16/21 2245  cefTRIAXone (ROCEPHIN) 2 g in sodium chloride 0.9 % 100 mL IVPB       "And" Linked Group Details   2 g 200 mL/hr over 30 Minutes Intravenous  Once 03/16/21 2243 03/17/21 0009   03/16/21 2245  metroNIDAZOLE (FLAGYL) IVPB 500 mg       "And" Linked Group Details   500 mg 100 mL/hr over 60 Minutes Intravenous  Once 03/16/21 2243 03/17/21 0108       Assessment/Plan HTN - presented with HTN urgency. Appreciate TRH assistance ETOH use - CIWA. TOC consult  Hx of Cocaine use  Perforated appendicitis with purulent appendicitis S/p Laparoscopic Appendectomy - Dr. Thermon Leyland - 03/17/21 - POD #2 - expected post-op ileus- change meds to IV/ liquid as able. MINIMIZE NARCOTICS - Cont IV abx. Likely 7 day course - Mobilize - Pulm toilet  FEN - FLD, IVF VTE - SCDs, Lovenox  ID - Rocephin/Flagyl 4/27 >> WBC 10.0.  Afebrile  Foley - None. Voiding Follow-Up - DOW     LOS: 2 days    Clovis Riley , [facility] 03/19/2021, 8:51 AM Please see Amion for pager number during day hours 7:00am-4:30pm

## 2021-03-20 LAB — GLUCOSE, CAPILLARY
Glucose-Capillary: 85 mg/dL (ref 70–99)
Glucose-Capillary: 92 mg/dL (ref 70–99)
Glucose-Capillary: 130 mg/dL — ABNORMAL HIGH (ref 70–99)

## 2021-03-20 LAB — CBC
HCT: 36.2 % (ref 36.0–46.0)
Hemoglobin: 12.2 g/dL (ref 12.0–15.0)
MCH: 31.9 pg (ref 26.0–34.0)
MCHC: 33.7 g/dL (ref 30.0–36.0)
MCV: 94.8 fL (ref 80.0–100.0)
Platelets: 240 10*3/uL (ref 150–400)
RBC: 3.82 MIL/uL — ABNORMAL LOW (ref 3.87–5.11)
RDW: 12.6 % (ref 11.5–15.5)
WBC: 10.6 10*3/uL — ABNORMAL HIGH (ref 4.0–10.5)
nRBC: 0 % (ref 0.0–0.2)

## 2021-03-20 LAB — BASIC METABOLIC PANEL
Anion gap: 12 (ref 5–15)
BUN: 12 mg/dL (ref 6–20)
CO2: 25 mmol/L (ref 22–32)
Calcium: 9 mg/dL (ref 8.9–10.3)
Chloride: 100 mmol/L (ref 98–111)
Creatinine, Ser: 0.83 mg/dL (ref 0.44–1.00)
GFR, Estimated: >60 mL/min (ref >60)
Glucose, Bld: 100 mg/dL — ABNORMAL HIGH (ref 70–99)
Potassium: 3.5 mmol/L (ref 3.5–5.1)
Sodium: 137 mmol/L (ref 135–145)

## 2021-03-20 LAB — MAGNESIUM: Magnesium: 2.2 mg/dL (ref 1.7–2.4)

## 2021-03-20 MED ORDER — HYDROMORPHONE HCL 1 MG/ML IJ SOLN
0.5000 mg | Freq: Three times a day (TID) | INTRAMUSCULAR | Status: DC | PRN
  Administered 2021-03-20 – 2021-03-23 (×8): 0.5 mg via INTRAVENOUS
  Filled 2021-03-20 (×9): qty 0.5

## 2021-03-20 MED ORDER — KETOROLAC TROMETHAMINE 15 MG/ML IJ SOLN
15.0000 mg | Freq: Four times a day (QID) | INTRAMUSCULAR | Status: DC | PRN
  Administered 2021-03-20 – 2021-03-21 (×4): 15 mg via INTRAVENOUS
  Filled 2021-03-20 (×4): qty 1

## 2021-03-20 MED ORDER — IBUPROFEN 200 MG PO TABS: 400.0000 mg | ORAL_TABLET | Freq: Four times a day (QID) | ORAL | Status: DC | PRN

## 2021-03-20 MED ORDER — OXYCODONE HCL 5 MG/5ML PO SOLN
5.0000 mg | Freq: Four times a day (QID) | ORAL | Status: DC | PRN
  Administered 2021-03-20 – 2021-03-21 (×2): 5 mg via ORAL
  Filled 2021-03-20 (×2): qty 5

## 2021-03-20 MED ORDER — GABAPENTIN 300 MG PO CAPS
300.0000 mg | ORAL_CAPSULE | Freq: Three times a day (TID) | ORAL | Status: DC
  Administered 2021-03-20 – 2021-03-24 (×12): 300 mg via ORAL
  Filled 2021-03-20 (×12): qty 1

## 2021-03-20 MED ORDER — AMLODIPINE BESYLATE 5 MG PO TABS
5.0000 mg | ORAL_TABLET | Freq: Every day | ORAL | Status: DC
  Administered 2021-03-20 – 2021-03-21 (×2): 5 mg via ORAL
  Filled 2021-03-20 (×2): qty 1

## 2021-03-20 MED ORDER — HYDRALAZINE HCL 20 MG/ML IJ SOLN
10.0000 mg | Freq: Four times a day (QID) | INTRAMUSCULAR | Status: DC | PRN
  Administered 2021-03-20 – 2021-03-21 (×2): 10 mg via INTRAVENOUS
  Filled 2021-03-20 (×2): qty 1

## 2021-03-20 NOTE — Progress Notes (Addendum)
3 Days Post-Op  Subjective: CC: Felt great and was requesting a soft diet yesterday afternoon which was ordered.  Although not recorded, bedside nurse reports that she has had 2 episodes of emesis (1 last night and one this morning).  She has been receiving Dilaudid and Zofran fairly regularly for the last 24 hours. Patient endorses continued loose bowel movements.  Objective: Vital signs in last 24 hours: Temp:  [97.9 F (36.6 C)-99.5 F (37.5 C)] 99 F (37.2 C) (05/01 0602) Pulse Rate:  [87-95] 95 (05/01 0809) Resp:  [15-20] 20 (05/01 0602) BP: (150-171)/(89-115) 171/109 (05/01 0809) SpO2:  [97 %-100 %] 97 % (05/01 0602) Last BM Date: 03/19/21  Intake/Output from previous day: 04/30 0701 - 05/01 0700 In: 1346 [P.O.:720; I.V.:426; IV Piggyback:200] Out: 1 [Stool:1] Intake/Output this shift: No intake/output data recorded.  PE: Gen: A bit sleepy this morning but easily awakens and is pleasant and comfortable. HEENT: EOM's intact, pupils equal and round Card:  RRR, no M/G/R heard Pulm:  CTAB, no W/R/R, effort normal Abd: Soft, mildly distended (less so than yesterday), appropriate incisional tenderness.  Incisions clean, dry and intact without hematoma or cellulitis Ext:  No LE edema  Psych: A&Ox3  Skin: no rashes noted, warm and dry   Lab Results:  Recent Labs    03/18/21 0513 03/20/21 0644  WBC 10.0 10.6*  HGB 12.3 12.2  HCT 37.4 36.2  PLT 221 240   BMET Recent Labs    03/18/21 0513 03/20/21 0644  NA 136 137  K 3.7 3.5  CL 101 100  CO2 27 25  GLUCOSE 113* 100*  BUN 10 12  CREATININE 0.88 0.83  CALCIUM 8.9 9.0   PT/INR No results for input(s): LABPROT, INR in the last 72 hours. CMP     Component Value Date/Time   NA 137 03/20/2021 0644   K 3.5 03/20/2021 0644   CL 100 03/20/2021 0644   CO2 25 03/20/2021 0644   GLUCOSE 100 (H) 03/20/2021 0644   BUN 12 03/20/2021 0644   CREATININE 0.83 03/20/2021 0644   CALCIUM 9.0 03/20/2021 0644   PROT 7.8  03/16/2021 2040   ALBUMIN 4.4 03/16/2021 2040   AST 22 03/16/2021 2040   ALT 22 03/16/2021 2040   ALKPHOS 82 03/16/2021 2040   BILITOT 0.9 03/16/2021 2040   GFRNONAA >60 03/20/2021 0644   GFRAA >60 06/13/2015 2224   Lipase     Component Value Date/Time   LIPASE 38 03/16/2021 2040       Studies/Results: No results found.  Anti-infectives: Anti-infectives (From admission, onward)   Start     Dose/Rate Route Frequency Ordered Stop   03/18/21 0830  cefTRIAXone (ROCEPHIN) 2 g in sodium chloride 0.9 % 100 mL IVPB        2 g 200 mL/hr over 30 Minutes Intravenous Every 24 hours 03/18/21 0738     03/18/21 0830  metroNIDAZOLE (FLAGYL) IVPB 500 mg        500 mg 100 mL/hr over 60 Minutes Intravenous Every 8 hours 03/18/21 0738     03/17/21 2300  cefTRIAXone (ROCEPHIN) 2 g in sodium chloride 0.9 % 100 mL IVPB  Status:  Discontinued        2 g 200 mL/hr over 30 Minutes Intravenous Every 24 hours 03/16/21 2322 03/17/21 1744   03/17/21 1600  metroNIDAZOLE (FLAGYL) IVPB 500 mg  Status:  Discontinued        500 mg 100 mL/hr over 60 Minutes Intravenous Every 8  hours 03/17/21 1103 03/17/21 1744   03/17/21 0800  metroNIDAZOLE (FLAGYL) IVPB 500 mg  Status:  Discontinued        500 mg 100 mL/hr over 60 Minutes Intravenous Every 8 hours 03/16/21 2322 03/17/21 1107   03/16/21 2245  cefTRIAXone (ROCEPHIN) 2 g in sodium chloride 0.9 % 100 mL IVPB       "And" Linked Group Details   2 g 200 mL/hr over 30 Minutes Intravenous  Once 03/16/21 2243 03/17/21 0009   03/16/21 2245  metroNIDAZOLE (FLAGYL) IVPB 500 mg       "And" Linked Group Details   500 mg 100 mL/hr over 60 Minutes Intravenous  Once 03/16/21 2243 03/17/21 0108       Assessment/Plan HTN - presented with HTN urgency. Appreciate TRH assistance ETOH use - CIWA. TOC consult  Hx of Cocaine use  Perforated appendicitis with purulent appendicitis S/p Laparoscopic Appendectomy - Dr. Thermon Leyland - 03/17/21 - POD #3 - expected post-op  ileus- change meds to IV/ liquid as able.  Given bowel function and slightly improved abdominal distention will leave her on a soft diet for now, patient advised to hold off if she is feeling nauseated or full.  MINIMIZE NARCOTICS- continue to wean IV meds as able -High risk for postoperative abscess.  Currently afebrile with no white count.  If clinical progress is slow or stalled, plan CT scan on or after postop day 5 - Cont IV abx. Likely 7 day course - Mobilize - Pulm toilet  FEN -soft, IVF VTE - SCDs, Lovenox  ID - Rocephin/Flagyl 4/27 >> WBC 10.0. Afebrile  Foley - None. Voiding Follow-Up - DOW     LOS: 3 days    Clovis Riley , MD [facility] 03/20/2021, 8:19 AM Please see Amion for pager number during day hours 7:00am-4:30pm

## 2021-03-20 NOTE — Progress Notes (Addendum)
Patients blood pressure was elevated at 188/110, PRN labetalol was given. Blood pressure is now 174/108. She does appear comfortable in a chair at this time. 3/10 pain. She was noted to have vomited and she continues to have nausea. Will continue to monitor. Collene Leyden, LPN.

## 2021-03-20 NOTE — Progress Notes (Signed)
Patient was sleeping and LCSW was unable to arouse, on 3 separate occasions.  Resource information and contact information left at beside, including all of the following information:   Mental Health/Substance Abuse, [facility], Residential Substance Use Treatment Services, Mental Health Resources: [facility], Alcohol and Drug Services, [facility].  Inpatient LCSW has been encouraged to consult with patient on 03/21/2021 to further assess needs.  Nat Christen, BSW, MSW, LCSW  Licensed Education officer, [facility]  Mailing [address] [place], [place] [address] Physical Address-[address], [place], [place] [address] Toll Free Main # [phone] Fax # [phone] Cell # [phone]  [email or web address]

## 2021-03-20 NOTE — Progress Notes (Addendum)
PROGRESS NOTE    Tracy Fischer  YPP:[id number] DOB: 1965-12-08 DOA: 03/16/2021 PCP: Pcp, No    Brief Narrative: This 55 years old female with PMH significant for essential hypertension , noncompliant with her medications, EtOH abuse, history of cocaine use admitted for acute appendicitis.  Patient was found to have perforated appendicitis with purulent appendicitis.  Patient underwent laparoscopic appendicectomy on 4/28.  Postoperatively developed ileus.  Started on IV antibiotics.  We were consulted for blood pressure management.  Assessment & Plan:   Principal Problem:   Acute appendicitis Active Problems:   Hypertensive urgency  Acute appendicitis:  CT abdomen shows perforated appendicitis with purulent drainage. Patient being followed by general surgery,  underwent laparoscopy appendicectomy on 4/28. Continue IV antibiotics as per surgery. She was started on soft bland diet,  tolerated well. Patient has 2 episodes of vomiting.  Continue on soft bland diet. Continue zofran. Minimize narcotics.  If she continue to have pain consider CT scanning on postoperative day 5.  Hypertensive urgency: Patient with history of hypertension,  noncompliance of medications. Blood pressure is now controlled.  Started on low-dose Coreg. Blood pressure up again, add amlodipine 5 mg daily  History of cocaine and alcohol abuse: Advised about quitting.   DVT prophylaxis:  Lovenox. Code Status:  Full code. Family Communication: No family at bed side. Disposition Plan:  Status is: Inpatient  Remains inpatient appropriate because:Inpatient level of care appropriate due to severity of illness   Dispo: The patient is from: Home              Anticipated d/c is to: Home              Patient currently is not medically stable to d/c.   Difficult to place patient No   Consultants:    None  Procedures:   Laparoscopic appendicectomy  Antimicrobials:   Anti-infectives (From admission,  onward)   Start     Dose/Rate Route Frequency Ordered Stop   03/18/21 0830  cefTRIAXone (ROCEPHIN) 2 g in sodium chloride 0.9 % 100 mL IVPB        2 g 200 mL/hr over 30 Minutes Intravenous Every 24 hours 03/18/21 0738     03/18/21 0830  metroNIDAZOLE (FLAGYL) IVPB 500 mg        500 mg 100 mL/hr over 60 Minutes Intravenous Every 8 hours 03/18/21 0738     03/17/21 2300  cefTRIAXone (ROCEPHIN) 2 g in sodium chloride 0.9 % 100 mL IVPB  Status:  Discontinued        2 g 200 mL/hr over 30 Minutes Intravenous Every 24 hours 03/16/21 2322 03/17/21 1744   03/17/21 1600  metroNIDAZOLE (FLAGYL) IVPB 500 mg  Status:  Discontinued        500 mg 100 mL/hr over 60 Minutes Intravenous Every 8 hours 03/17/21 1103 03/17/21 1744   03/17/21 0800  metroNIDAZOLE (FLAGYL) IVPB 500 mg  Status:  Discontinued        500 mg 100 mL/hr over 60 Minutes Intravenous Every 8 hours 03/16/21 2322 03/17/21 1107   03/16/21 2245  cefTRIAXone (ROCEPHIN) 2 g in sodium chloride 0.9 % 100 mL IVPB       "And" Linked Group Details   2 g 200 mL/hr over 30 Minutes Intravenous  Once 03/16/21 2243 03/17/21 0009   03/16/21 2245  metroNIDAZOLE (FLAGYL) IVPB 500 mg       "And" Linked Group Details   500 mg 100 mL/hr over 60 Minutes Intravenous  Once 03/16/21  2243 03/17/21 0108     Subjective: Patient was seen and examined at bedside.  Overnight events noted.  Patient reports having abdominal soreness.  She threw up twice.  Blood pressure is up again, tolerated soft bland diet.  Objective: Vitals:   03/20/21 0602 03/20/21 0809 03/20/21 1043 03/20/21 1358  BP: (!) 158/115 (!) 171/109 (!) 167/98 (!) 166/99  Pulse: 95 95 96 89  Resp: 20   17  Temp: 99 F (37.2 C)   100.2 F (37.9 C)  TempSrc:    Oral  SpO2: 97%   99%  Weight:      Height:        Intake/Output Summary (Last 24 hours) at 03/20/2021 1431 Last data filed at 03/20/2021 1325 Gross per 24 hour  Intake 1106 ml  Output 1 ml  Net 1105 ml   Filed Weights   03/16/21  1937 03/17/21 1333  Weight: 59 kg 59 kg    Examination:  General exam: Appears calm and comfortable, not in any acute distress. Respiratory system: Clear to auscultation. Respiratory effort normal. Cardiovascular system: S1 & S2 heard, RRR. No JVD, murmurs, rubs, gallops or clicks. No pedal edema. Gastrointestinal system: Abdomen is mildly distended, soft and mildly tender. No organomegaly or masses felt. Normal bowel sounds heard. Central nervous system: Alert and oriented. No focal neurological deficits. Extremities: No edema, no cyanosis, no clubbing. Skin: No rashes, lesions or ulcers Psychiatry: Judgement and insight appear normal. Mood & affect appropriate.     Data Reviewed: I have personally reviewed following labs and imaging studies  CBC: Recent Labs  Lab 03/16/21 2040 03/17/21 0542 03/18/21 0513 03/20/21 0644  WBC 14.6* 14.7* 10.0 10.6*  NEUTROABS 12.1*  --   --   --   HGB 14.4 12.9 12.3 12.2  HCT 42.2 38.5 37.4 36.2  MCV 94.0 97.2 97.7 94.8  PLT 259 238 221 160   Basic Metabolic Panel: Recent Labs  Lab 03/16/21 2040 03/17/21 0542 03/18/21 0513 03/20/21 0644  NA 142 142 136 137  K 3.8 3.6 3.7 3.5  CL 104 109 101 100  CO2 24 25 27 25   GLUCOSE 116* 157* 113* 100*  BUN 11 8 10 12   CREATININE 0.98 0.82 0.88 0.83  CALCIUM 9.8 8.8* 8.9 9.0  MG  --   --   --  2.2   GFR: Estimated Creatinine Clearance: 69.7 mL/min (by C-G formula based on SCr of 0.83 mg/dL). Liver Function Tests: Recent Labs  Lab 03/16/21 2040  AST 22  ALT 22  ALKPHOS 82  BILITOT 0.9  PROT 7.8  ALBUMIN 4.4   Recent Labs  Lab 03/16/21 2040  LIPASE 38   No results for input(s): AMMONIA in the last 168 hours. Coagulation Profile: No results for input(s): INR, PROTIME in the last 168 hours. Cardiac Enzymes: No results for input(s): CKTOTAL, CKMB, CKMBINDEX, TROPONINI in the last 168 hours. BNP (last 3 results) No results for input(s): PROBNP in the last 8760 hours. HbA1C: No  results for input(s): HGBA1C in the last 72 hours. CBG: Recent Labs  Lab 03/19/21 1141 03/19/21 1641 03/19/21 2347 03/20/21 0606 03/20/21 1154  GLUCAP 92 81 104* 85 92   Lipid Profile: No results for input(s): CHOL, HDL, LDLCALC, TRIG, CHOLHDL, LDLDIRECT in the last 72 hours. Thyroid Function Tests: No results for input(s): TSH, T4TOTAL, FREET4, T3FREE, THYROIDAB in the last 72 hours. Anemia Panel: No results for input(s): VITAMINB12, FOLATE, FERRITIN, TIBC, IRON, RETICCTPCT in the last 72 hours. Sepsis Labs:  No results for input(s): PROCALCITON, LATICACIDVEN in the last 168 hours.  Recent Results (from the past 240 hour(s))  Resp Panel by RT-PCR (Flu A&B, Covid) Nasopharyngeal Swab     Status: None   Collection Time: 03/16/21 10:37 PM   Specimen: Nasopharyngeal Swab; Nasopharyngeal(NP) swabs in vial transport medium  Result Value Ref Range Status   SARS Coronavirus 2 by RT PCR NEGATIVE NEGATIVE Final    Comment: (NOTE) SARS-CoV-2 target nucleic acids are NOT DETECTED.  The SARS-CoV-2 RNA is generally detectable in upper respiratory specimens during the acute phase of infection. The lowest concentration of SARS-CoV-2 viral copies this assay can detect is 138 copies/mL. A negative result does not preclude SARS-Cov-2 infection and should not be used as the sole basis for treatment or other patient management decisions. A negative result may occur with  improper specimen collection/handling, submission of specimen other than nasopharyngeal swab, presence of viral mutation(s) within the areas targeted by this assay, and inadequate number of viral copies(<138 copies/mL). A negative result must be combined with clinical observations, patient history, and epidemiological information. The expected result is Negative.  Fact Sheet for Patients:  EntrepreneurPulse.com.au  Fact Sheet for Healthcare Providers:  IncredibleEmployment.be  This test  is no t yet approved or cleared by the [place] FDA and  has been authorized for detection and/or diagnosis of SARS-CoV-2 by FDA under an Emergency Use Authorization (EUA). This EUA will remain  in effect (meaning this test can be used) for the duration of the COVID-19 declaration under Section 564(b)(1) of the Act, 21 U.S.C.section 360bbb-3(b)(1), unless the authorization is terminated  or revoked sooner.       Influenza A by PCR NEGATIVE NEGATIVE Final   Influenza B by PCR NEGATIVE NEGATIVE Final    Comment: (NOTE) The Xpert Xpress SARS-CoV-2/FLU/RSV plus assay is intended as an aid in the diagnosis of influenza from Nasopharyngeal swab specimens and should not be used as a sole basis for treatment. Nasal washings and aspirates are unacceptable for Xpert Xpress SARS-CoV-2/FLU/RSV testing.  Fact Sheet for Patients: EntrepreneurPulse.com.au  Fact Sheet for Healthcare Providers: IncredibleEmployment.be  This test is not yet approved or cleared by the [place] FDA and has been authorized for detection and/or diagnosis of SARS-CoV-2 by FDA under an Emergency Use Authorization (EUA). This EUA will remain in effect (meaning this test can be used) for the duration of the COVID-19 declaration under Section 564(b)(1) of the Act, 21 U.S.C. section 360bbb-3(b)(1), unless the authorization is terminated or revoked.  Performed at [facility], [place] [address] [place], [place] [address]    Radiology Studies: No results found.  Scheduled Meds: . acetaminophen (TYLENOL) oral liquid 160 mg/5 mL  650 mg Oral Q6H  . amLODipine  5 mg Oral Daily  . carvedilol  3.125 mg Oral BID WC  . docusate sodium  100 mg Oral BID  . enoxaparin (LOVENOX) injection  40 mg Subcutaneous Q24H  . folic acid  1 mg Oral Daily  . gabapentin  300 mg Oral TID  . multivitamin with minerals  1 tablet Oral Daily  . pneumococcal 23 valent vaccine  0.5  mL Intramuscular Tomorrow-1000  . thiamine  100 mg Oral Daily   Or  . thiamine  100 mg Intravenous Daily   Continuous Infusions: . cefTRIAXone (ROCEPHIN)  IV 2 g (03/20/21 0801)  . lactated ringers 75 mL/hr at 03/20/21 0136  . methocarbamol (ROBAXIN) IV    . metronidazole 500 mg (03/20/21 1001)  LOS: 3 days    Time spent: 25 mins.    Shawna Clamp, MD [facility]   If 7PM-7AM, please contact night-coverage

## 2021-03-21 LAB — GLUCOSE, CAPILLARY
Glucose-Capillary: 102 mg/dL — ABNORMAL HIGH (ref 70–99)
Glucose-Capillary: 109 mg/dL — ABNORMAL HIGH (ref 70–99)
Glucose-Capillary: 116 mg/dL — ABNORMAL HIGH (ref 70–99)
Glucose-Capillary: 121 mg/dL — ABNORMAL HIGH (ref 70–99)
Glucose-Capillary: 125 mg/dL — ABNORMAL HIGH (ref 70–99)

## 2021-03-21 LAB — CBC
HCT: 33.7 % — ABNORMAL LOW (ref 36.0–46.0)
Hemoglobin: 11.3 g/dL — ABNORMAL LOW (ref 12.0–15.0)
MCH: 31.7 pg (ref 26.0–34.0)
MCHC: 33.5 g/dL (ref 30.0–36.0)
MCV: 94.4 fL (ref 80.0–100.0)
Platelets: 239 10*3/uL (ref 150–400)
RBC: 3.57 MIL/uL — ABNORMAL LOW (ref 3.87–5.11)
RDW: 12.6 % (ref 11.5–15.5)
WBC: 8.2 10*3/uL (ref 4.0–10.5)
nRBC: 0 % (ref 0.0–0.2)

## 2021-03-21 LAB — BASIC METABOLIC PANEL
Anion gap: 12 (ref 5–15)
BUN: 11 mg/dL (ref 6–20)
CO2: 26 mmol/L (ref 22–32)
Calcium: 8.7 mg/dL — ABNORMAL LOW (ref 8.9–10.3)
Chloride: 100 mmol/L (ref 98–111)
Creatinine, Ser: 0.63 mg/dL (ref 0.44–1.00)
GFR, Estimated: >60 mL/min (ref >60)
Glucose, Bld: 105 mg/dL — ABNORMAL HIGH (ref 70–99)
Potassium: 3.1 mmol/L — ABNORMAL LOW (ref 3.5–5.1)
Sodium: 138 mmol/L (ref 135–145)

## 2021-03-21 LAB — MAGNESIUM: Magnesium: 2 mg/dL (ref 1.7–2.4)

## 2021-03-21 LAB — SURGICAL PATHOLOGY

## 2021-03-21 MED ORDER — AMLODIPINE BESYLATE 5 MG PO TABS
5.0000 mg | ORAL_TABLET | Freq: Once | ORAL | Status: AC
  Administered 2021-03-21: 5 mg via ORAL
  Filled 2021-03-21: qty 1

## 2021-03-21 MED ORDER — AMLODIPINE BESYLATE 10 MG PO TABS
10.0000 mg | ORAL_TABLET | Freq: Every day | ORAL | Status: DC
  Administered 2021-03-22 – 2021-03-24 (×3): 10 mg via ORAL
  Filled 2021-03-21 (×3): qty 1

## 2021-03-21 MED ORDER — POTASSIUM CHLORIDE 10 MEQ/100ML IV SOLN
10.0000 meq | INTRAVENOUS | Status: AC
  Administered 2021-03-21: 10 meq via INTRAVENOUS
  Filled 2021-03-21: qty 100

## 2021-03-21 MED ORDER — POTASSIUM CHLORIDE CRYS ER 10 MEQ PO TBCR
10.0000 meq | EXTENDED_RELEASE_TABLET | Freq: Once | ORAL | Status: AC
  Administered 2021-03-21: 10 meq via ORAL
  Filled 2021-03-21: qty 1

## 2021-03-21 NOTE — Progress Notes (Addendum)
4 Days Post-Op  Subjective: CC: Patient reports since yesterday morning she has had no further episodes of emesis.  She has had continued nausea that Zofran has been helping with.  She is only tolerating a few bites of each meal.  She is passing flatus.  She notes a liquidy BM this morning.  She is voiding without difficulty and denies any dysuria.  Mobilizing. Tmax 100.2 in the last 24 hours.   Objective: Vital signs in last 24 hours: Temp:  [98.6 F (37 C)-100.2 F (37.9 C)] 98.6 F (37 C) (05/02 0619) Pulse Rate:  [82-94] 89 (05/02 0619) Resp:  [17-18] 17 (05/02 0619) BP: (158-188)/(89-113) 158/96 (05/02 0619) SpO2:  [98 %-99 %] 99 % (05/02 0619) Last BM Date: 03/21/21  Intake/Output from previous day: 05/01 0701 - 05/02 0700 In: 360 [P.O.:360] Out: -  Intake/Output this shift: Total I/O In: 355 [P.O.:355] Out: -   PE: Gen:  Alert, NAD, pleasant Card:  Reg Pulm:  CTAB, no W/R/R, effort normal Abd: Soft, ND, generalized tenderness greatest in the right mid abdomen and near umbilicus without peritonitis. Slightly hypoactive BS. Incisions with glue intact appears well and are without drainage, bleeding, or signs of infection Ext:  No LE edema  Psych: A&Ox3  Skin: no rashes noted, warm and dry   Lab Results:  Recent Labs    03/20/21 0644 03/21/21 0515  WBC 10.6* 8.2  HGB 12.2 11.3*  HCT 36.2 33.7*  PLT 240 239   BMET Recent Labs    03/20/21 0644 03/21/21 0515  NA 137 138  K 3.5 3.1*  CL 100 100  CO2 25 26  GLUCOSE 100* 105*  BUN 12 11  CREATININE 0.83 0.63  CALCIUM 9.0 8.7*   PT/INR No results for input(s): LABPROT, INR in the last 72 hours. CMP     Component Value Date/Time   NA 138 03/21/2021 0515   K 3.1 (L) 03/21/2021 0515   CL 100 03/21/2021 0515   CO2 26 03/21/2021 0515   GLUCOSE 105 (H) 03/21/2021 0515   BUN 11 03/21/2021 0515   CREATININE 0.63 03/21/2021 0515   CALCIUM 8.7 (L) 03/21/2021 0515   PROT 7.8 03/16/2021 2040   ALBUMIN  4.4 03/16/2021 2040   AST 22 03/16/2021 2040   ALT 22 03/16/2021 2040   ALKPHOS 82 03/16/2021 2040   BILITOT 0.9 03/16/2021 2040   GFRNONAA >60 03/21/2021 0515   GFRAA >60 06/13/2015 2224   Lipase     Component Value Date/Time   LIPASE 38 03/16/2021 2040       Studies/Results: No results found.  Anti-infectives: Anti-infectives (From admission, onward)   Start     Dose/Rate Route Frequency Ordered Stop   03/18/21 0830  cefTRIAXone (ROCEPHIN) 2 g in sodium chloride 0.9 % 100 mL IVPB        2 g 200 mL/hr over 30 Minutes Intravenous Every 24 hours 03/18/21 0738     03/18/21 0830  metroNIDAZOLE (FLAGYL) IVPB 500 mg        500 mg 100 mL/hr over 60 Minutes Intravenous Every 8 hours 03/18/21 0738     03/17/21 2300  cefTRIAXone (ROCEPHIN) 2 g in sodium chloride 0.9 % 100 mL IVPB  Status:  Discontinued        2 g 200 mL/hr over 30 Minutes Intravenous Every 24 hours 03/16/21 2322 03/17/21 1744   03/17/21 1600  metroNIDAZOLE (FLAGYL) IVPB 500 mg  Status:  Discontinued  500 mg 100 mL/hr over 60 Minutes Intravenous Every 8 hours 03/17/21 1103 03/17/21 1744   03/17/21 0800  metroNIDAZOLE (FLAGYL) IVPB 500 mg  Status:  Discontinued        500 mg 100 mL/hr over 60 Minutes Intravenous Every 8 hours 03/16/21 2322 03/17/21 1107   03/16/21 2245  cefTRIAXone (ROCEPHIN) 2 g in sodium chloride 0.9 % 100 mL IVPB       "And" Linked Group Details   2 g 200 mL/hr over 30 Minutes Intravenous  Once 03/16/21 2243 03/17/21 0009   03/16/21 2245  metroNIDAZOLE (FLAGYL) IVPB 500 mg       "And" Linked Group Details   500 mg 100 mL/hr over 60 Minutes Intravenous  Once 03/16/21 2243 03/17/21 0108       Assessment/Plan HTN - presented with HTN urgency. Appreciate TRH assistance in medication adjustment  ETOH use - CIWA. TOC consult  Hx of Cocaine use  Perforated appendicitis with purulent appendicitis S/p Laparoscopic Appendectomy - Dr. Thermon Leyland - 03/17/21 - POD #4 - Patient with slowly  improving post operative ileus. Okay for continue soft diet for now, patient advised to hold off if she is feeling nauseated or full.  MINIMIZE NARCOTICS- continue to wean IV meds as able - High risk for postoperative abscess.  Will plan for CT scan tomorrow.  - Cont IV abx. Likely 7 day course - Mobilize - Pulm toilet  FEN - Soft, IVF, replace K 3.1 VTE - SCDs, Lovenox  ID - Rocephin/Flagyl 4/27 >> WBC 18.2. Tmax 100.2  Foley - None. Voiding Follow-Up - DOW   LOS: 4 days    Jillyn Ledger , [facility] 03/21/2021, 12:03 PM Please see Amion for pager number during day hours 7:00am-4:30pm

## 2021-03-21 NOTE — Progress Notes (Signed)
PROGRESS NOTE    Tracy Fischer  [id number] DOB: 1966-01-18 DOA: 03/16/2021 PCP: Pcp, No    Brief Narrative: This 55 years old female with PMH significant for essential hypertension , noncompliant with her medications, EtOH abuse, history of cocaine use admitted for acute appendicitis.  Patient was found to have perforated appendicitis with purulent appendicitis.  Patient underwent laparoscopic appendicectomy on 4/28.  Postoperatively developed ileus.  Started on IV antibiotics.  We were consulted for blood pressure management.  Assessment & Plan:   Principal Problem:   Acute appendicitis Active Problems:   Hypertensive urgency  Acute appendicitis:  CT abdomen shows perforated appendicitis with purulent drainage. Patient being followed by general surgery,  underwent laparoscopy appendicectomy on 4/28. Continue IV antibiotics as per surgery for 7 days. She was started on soft bland diet,  tolerated well. Patient has 2 episodes of vomiting.  Continued on soft bland diet. Continue zofran. Minimize narcotics.  If she continue to have pain consider CT scanning on postoperative day 5.  Scheduled to have CT abdomen pelvis tomorrow morning.  Hypertensive urgency: Patient with history of hypertension,  noncompliance of medications. Blood pressure is now controlled.  Started on low-dose Coreg. Blood pressure up again, added amlodipine 5 mg daily on 5/1. Increase amlodipine to 10 mg daily  History of cocaine and alcohol abuse: Advised about quitting.   DVT prophylaxis:  Lovenox. Code Status:  Full code. Family Communication: No family at bed side. Disposition Plan:  Status is: Inpatient  Remains inpatient appropriate because:Inpatient level of care appropriate due to severity of illness   Dispo: The patient is from: Home              Anticipated d/c is to: Home              Patient currently is not medically stable to d/c.   Difficult to place patient No   Consultants:     None  Procedures:   Laparoscopic appendicectomy  Antimicrobials:   Anti-infectives (From admission, onward)   Start     Dose/Rate Route Frequency Ordered Stop   03/18/21 0830  cefTRIAXone (ROCEPHIN) 2 g in sodium chloride 0.9 % 100 mL IVPB        2 g 200 mL/hr over 30 Minutes Intravenous Every 24 hours 03/18/21 0738     03/18/21 0830  metroNIDAZOLE (FLAGYL) IVPB 500 mg        500 mg 100 mL/hr over 60 Minutes Intravenous Every 8 hours 03/18/21 0738     03/17/21 2300  cefTRIAXone (ROCEPHIN) 2 g in sodium chloride 0.9 % 100 mL IVPB  Status:  Discontinued        2 g 200 mL/hr over 30 Minutes Intravenous Every 24 hours 03/16/21 2322 03/17/21 1744   03/17/21 1600  metroNIDAZOLE (FLAGYL) IVPB 500 mg  Status:  Discontinued        500 mg 100 mL/hr over 60 Minutes Intravenous Every 8 hours 03/17/21 1103 03/17/21 1744   03/17/21 0800  metroNIDAZOLE (FLAGYL) IVPB 500 mg  Status:  Discontinued        500 mg 100 mL/hr over 60 Minutes Intravenous Every 8 hours 03/16/21 2322 03/17/21 1107   03/16/21 2245  cefTRIAXone (ROCEPHIN) 2 g in sodium chloride 0.9 % 100 mL IVPB       "And" Linked Group Details   2 g 200 mL/hr over 30 Minutes Intravenous  Once 03/16/21 2243 03/17/21 0009   03/16/21 2245  metroNIDAZOLE (FLAGYL) IVPB 500 mg       "  And" Linked Group Details   500 mg 100 mL/hr over 60 Minutes Intravenous  Once 03/16/21 2243 03/17/21 0108     Subjective: Patient was seen and examined at bedside.  Overnight events noted.  Patient reports having abdominal soreness.  Able to tolerate soft bland diet.  Blood pressure is up again, denies further vomiting.  Abdomen is still slightly distended.  Able to pass flatus Objective: Vitals:   03/20/21 1834 03/20/21 1902 03/20/21 2005 03/21/21 0619  BP: (!) 184/105 (!) 166/102 (!) 158/89 (!) 158/96  Pulse: 82 89 94 89  Resp:   18 17  Temp:   98.8 F (37.1 C) 98.6 F (37 C)  TempSrc:    Oral  SpO2:   98% 99%  Weight:      Height:         Intake/Output Summary (Last 24 hours) at 03/21/2021 1520 Last data filed at 03/21/2021 0953 Gross per 24 hour  Intake 475 ml  Output --  Net 475 ml   Filed Weights   03/16/21 1937 03/17/21 1333  Weight: 59 kg 59 kg    Examination:  General exam: Appears calm and comfortable, not in any acute distress. Respiratory system: Clear to auscultation. Respiratory effort normal. Cardiovascular system: S1 & S2 heard, RRR. No JVD, murmurs, rubs, gallops or clicks. No pedal edema. Gastrointestinal system: Abdomen is mildly distended, soft and mildly tender. No organomegaly or masses felt. Normal bowel sounds heard. Central nervous system: Alert and oriented x 3. No focal neurological deficits. Extremities: No edema, no cyanosis, no clubbing. Skin: No rashes, lesions or ulcers Psychiatry: Judgement and insight appear normal. Mood & affect appropriate.     Data Reviewed: I have personally reviewed following labs and imaging studies  CBC: Recent Labs  Lab 03/16/21 2040 03/17/21 0542 03/18/21 0513 03/20/21 0644 03/21/21 0515  WBC 14.6* 14.7* 10.0 10.6* 8.2  NEUTROABS 12.1*  --   --   --   --   HGB 14.4 12.9 12.3 12.2 11.3*  HCT 42.2 38.5 37.4 36.2 33.7*  MCV 94.0 97.2 97.7 94.8 94.4  PLT 259 238 221 240 626   Basic Metabolic Panel: Recent Labs  Lab 03/16/21 2040 03/17/21 0542 03/18/21 0513 03/20/21 0644 03/21/21 0515  NA 142 142 136 137 138  K 3.8 3.6 3.7 3.5 3.1*  CL 104 109 101 100 100  CO2 24 25 27 25 26   GLUCOSE 116* 157* 113* 100* 105*  BUN 11 8 10 12 11   CREATININE 0.98 0.82 0.88 0.83 0.63  CALCIUM 9.8 8.8* 8.9 9.0 8.7*  MG  --   --   --  2.2 2.0   GFR: Estimated Creatinine Clearance: 72.3 mL/min (by C-G formula based on SCr of 0.63 mg/dL). Liver Function Tests: Recent Labs  Lab 03/16/21 2040  AST 22  ALT 22  ALKPHOS 82  BILITOT 0.9  PROT 7.8  ALBUMIN 4.4   Recent Labs  Lab 03/16/21 2040  LIPASE 38   No results for input(s): AMMONIA in the last  168 hours. Coagulation Profile: No results for input(s): INR, PROTIME in the last 168 hours. Cardiac Enzymes: No results for input(s): CKTOTAL, CKMB, CKMBINDEX, TROPONINI in the last 168 hours. BNP (last 3 results) No results for input(s): PROBNP in the last 8760 hours. HbA1C: No results for input(s): HGBA1C in the last 72 hours. CBG: Recent Labs  Lab 03/20/21 1154 03/20/21 1617 03/21/21 0007 03/21/21 0622 03/21/21 1201  GLUCAP 92 130* 109* 125* 121*   Lipid Profile: No  results for input(s): CHOL, HDL, LDLCALC, TRIG, CHOLHDL, LDLDIRECT in the last 72 hours. Thyroid Function Tests: No results for input(s): TSH, T4TOTAL, FREET4, T3FREE, THYROIDAB in the last 72 hours. Anemia Panel: No results for input(s): VITAMINB12, FOLATE, FERRITIN, TIBC, IRON, RETICCTPCT in the last 72 hours. Sepsis Labs: No results for input(s): PROCALCITON, LATICACIDVEN in the last 168 hours.  Recent Results (from the past 240 hour(s))  Resp Panel by RT-PCR (Flu A&B, Covid) Nasopharyngeal Swab     Status: None   Collection Time: 03/16/21 10:37 PM   Specimen: Nasopharyngeal Swab; Nasopharyngeal(NP) swabs in vial transport medium  Result Value Ref Range Status   SARS Coronavirus 2 by RT PCR NEGATIVE NEGATIVE Final    Comment: (NOTE) SARS-CoV-2 target nucleic acids are NOT DETECTED.  The SARS-CoV-2 RNA is generally detectable in upper respiratory specimens during the acute phase of infection. The lowest concentration of SARS-CoV-2 viral copies this assay can detect is 138 copies/mL. A negative result does not preclude SARS-Cov-2 infection and should not be used as the sole basis for treatment or other patient management decisions. A negative result may occur with  improper specimen collection/handling, submission of specimen other than nasopharyngeal swab, presence of viral mutation(s) within the areas targeted by this assay, and inadequate number of viral copies(<138 copies/mL). A negative result must  be combined with clinical observations, patient history, and epidemiological information. The expected result is Negative.  Fact Sheet for Patients:  EntrepreneurPulse.com.au  Fact Sheet for Healthcare Providers:  IncredibleEmployment.be  This test is no t yet approved or cleared by the [place] FDA and  has been authorized for detection and/or diagnosis of SARS-CoV-2 by FDA under an Emergency Use Authorization (EUA). This EUA will remain  in effect (meaning this test can be used) for the duration of the COVID-19 declaration under Section 564(b)(1) of the Act, 21 U.S.C.section 360bbb-3(b)(1), unless the authorization is terminated  or revoked sooner.       Influenza A by PCR NEGATIVE NEGATIVE Final   Influenza B by PCR NEGATIVE NEGATIVE Final    Comment: (NOTE) The Xpert Xpress SARS-CoV-2/FLU/RSV plus assay is intended as an aid in the diagnosis of influenza from Nasopharyngeal swab specimens and should not be used as a sole basis for treatment. Nasal washings and aspirates are unacceptable for Xpert Xpress SARS-CoV-2/FLU/RSV testing.  Fact Sheet for Patients: EntrepreneurPulse.com.au  Fact Sheet for Healthcare Providers: IncredibleEmployment.be  This test is not yet approved or cleared by the [place] FDA and has been authorized for detection and/or diagnosis of SARS-CoV-2 by FDA under an Emergency Use Authorization (EUA). This EUA will remain in effect (meaning this test can be used) for the duration of the COVID-19 declaration under Section 564(b)(1) of the Act, 21 U.S.C. section 360bbb-3(b)(1), unless the authorization is terminated or revoked.  Performed at [facility], [place] [address] [place],  [address]    Radiology Studies: No results found.  Scheduled Meds: . acetaminophen (TYLENOL) oral liquid 160 mg/5 mL  650 mg Oral Q6H  . [START ON 03/22/2021]  amLODipine  10 mg Oral Daily  . amLODipine  5 mg Oral Once  . carvedilol  3.125 mg Oral BID WC  . docusate sodium  100 mg Oral BID  . enoxaparin (LOVENOX) injection  40 mg Subcutaneous Q24H  . folic acid  1 mg Oral Daily  . gabapentin  300 mg Oral TID  . multivitamin with minerals  1 tablet Oral Daily  . pneumococcal 23 valent vaccine  0.5  mL Intramuscular Tomorrow-1000  . thiamine  100 mg Oral Daily   Or  . thiamine  100 mg Intravenous Daily   Continuous Infusions: . cefTRIAXone (ROCEPHIN)  IV 2 g (03/21/21 1015)  . lactated ringers 75 mL/hr at 03/20/21 1730  . methocarbamol (ROBAXIN) IV    . metronidazole 500 mg (03/21/21 1121)     LOS: 4 days    Time spent: 25 mins.    Shawna Clamp, MD [facility]   If 7PM-7AM, please contact night-coverage

## 2021-03-22 ENCOUNTER — Inpatient Hospital Stay (HOSPITAL_COMMUNITY): Payer: Self-pay

## 2021-03-22 LAB — BASIC METABOLIC PANEL
Anion gap: 10 (ref 5–15)
BUN: 7 mg/dL (ref 6–20)
CO2: 24 mmol/L (ref 22–32)
Calcium: 8.4 mg/dL — ABNORMAL LOW (ref 8.9–10.3)
Chloride: 101 mmol/L (ref 98–111)
Creatinine, Ser: 0.72 mg/dL (ref 0.44–1.00)
GFR, Estimated: >60 mL/min (ref >60)
Glucose, Bld: 115 mg/dL — ABNORMAL HIGH (ref 70–99)
Potassium: 3.2 mmol/L — ABNORMAL LOW (ref 3.5–5.1)
Sodium: 135 mmol/L (ref 135–145)

## 2021-03-22 LAB — CBC
HCT: 33.4 % — ABNORMAL LOW (ref 36.0–46.0)
Hemoglobin: 11.1 g/dL — ABNORMAL LOW (ref 12.0–15.0)
MCH: 31.4 pg (ref 26.0–34.0)
MCHC: 33.2 g/dL (ref 30.0–36.0)
MCV: 94.4 fL (ref 80.0–100.0)
Platelets: 284 10*3/uL (ref 150–400)
RBC: 3.54 MIL/uL — ABNORMAL LOW (ref 3.87–5.11)
RDW: 12.7 % (ref 11.5–15.5)
WBC: 10.6 10*3/uL — ABNORMAL HIGH (ref 4.0–10.5)
nRBC: 0 % (ref 0.0–0.2)

## 2021-03-22 LAB — GLUCOSE, CAPILLARY
Glucose-Capillary: 124 mg/dL — ABNORMAL HIGH (ref 70–99)
Glucose-Capillary: 95 mg/dL (ref 70–99)
Glucose-Capillary: 111 mg/dL — ABNORMAL HIGH (ref 70–99)

## 2021-03-22 MED ORDER — OXYCODONE HCL 5 MG/5ML PO SOLN
5.0000 mg | Freq: Four times a day (QID) | ORAL | Status: DC | PRN
  Administered 2021-03-22 – 2021-03-24 (×6): 10 mg via ORAL
  Filled 2021-03-22 (×6): qty 10

## 2021-03-22 MED ORDER — POTASSIUM CHLORIDE CRYS ER 20 MEQ PO TBCR
40.0000 meq | EXTENDED_RELEASE_TABLET | Freq: Once | ORAL | Status: AC
  Administered 2021-03-22: 40 meq via ORAL
  Filled 2021-03-22: qty 2

## 2021-03-22 MED ORDER — IOHEXOL 9 MG/ML PO SOLN
500.0000 mL | ORAL | Status: AC
  Administered 2021-03-22 (×2): 500 mL via ORAL

## 2021-03-22 MED ORDER — IOHEXOL 9 MG/ML PO SOLN
ORAL | Status: AC
  Administered 2021-03-22: 500 mL
  Filled 2021-03-22: qty 1000

## 2021-03-22 MED ORDER — PIPERACILLIN-TAZOBACTAM 3.375 G IVPB 30 MIN
3.3750 g | Freq: Three times a day (TID) | INTRAVENOUS | Status: DC
  Administered 2021-03-22 – 2021-03-24 (×7): 3.375 g via INTRAVENOUS
  Filled 2021-03-22 (×8): qty 50

## 2021-03-22 MED ORDER — IOHEXOL 300 MG/ML  SOLN
100.0000 mL | Freq: Once | INTRAMUSCULAR | Status: AC | PRN
  Administered 2021-03-22: 100 mL via INTRAVENOUS

## 2021-03-22 NOTE — Progress Notes (Signed)
PROGRESS NOTE    Tracy Fischer  [id number] DOB: 24-Jul-1966 DOA: 03/16/2021 PCP: Pcp, No    Brief Narrative: This 55 years old female with PMH significant for essential hypertension , noncompliant with her medications, EtOH abuse, history of cocaine use admitted for acute appendicitis.  Patient was found to have perforated appendicitis with purulent appendicitis.  Patient underwent laparoscopic appendicectomy on 4/28.  Postoperatively developed ileus.  Started on IV antibiotics.  We were consulted for blood pressure management.  Assessment & Plan:   Principal Problem:   Acute appendicitis Active Problems:   Hypertensive urgency  Acute appendicitis:  CT abdomen shows perforated appendicitis with purulent drainage. Patient being followed by general surgery,  underwent laparoscopy appendicectomy on 4/28. Continue IV antibiotics as per surgery for 7 days. She was started on soft bland diet,  Patient has 2 episodes of vomiting.  Continued on soft bland diet. Continue zofran. Minimize narcotics.  If she continue to have pain consider CT scanning on postoperative day 5.   CT abdomen and pelvis showed postoperative fluid collection. Further plan as per general surgery.  Hypertensive urgency: > Improving Patient with history of hypertension,  noncompliance of medications. Blood pressure is now controlled.  Started on low-dose Coreg. Blood pressure up again, added amlodipine 5 mg daily on 5/1. Increased amlodipine to 10 mg daily on 5/2  History of cocaine and alcohol abuse: Advised about quitting.   DVT prophylaxis:  Lovenox. Code Status:  Full code. Family Communication: No family at bed side. Disposition Plan:  Status is: Inpatient  Remains inpatient appropriate because:Inpatient level of care appropriate due to severity of illness   Dispo: The patient is from: Home              Anticipated d/c is to: Home              Patient currently is not medically stable to  d/c.   Difficult to place patient No   Consultants:    None  Procedures:   Laparoscopic appendicectomy  Antimicrobials:   Anti-infectives (From admission, onward)   Start     Dose/Rate Route Frequency Ordered Stop   03/22/21 1400  piperacillin-tazobactam (ZOSYN) IVPB 3.375 g        3.375 g 12.5 mL/hr over 240 Minutes Intravenous Every 8 hours 03/22/21 1307     03/18/21 0830  cefTRIAXone (ROCEPHIN) 2 g in sodium chloride 0.9 % 100 mL IVPB  Status:  Discontinued        2 g 200 mL/hr over 30 Minutes Intravenous Every 24 hours 03/18/21 0738 03/22/21 1307   03/18/21 0830  metroNIDAZOLE (FLAGYL) IVPB 500 mg  Status:  Discontinued        500 mg 100 mL/hr over 60 Minutes Intravenous Every 8 hours 03/18/21 0738 03/22/21 1307   03/17/21 2300  cefTRIAXone (ROCEPHIN) 2 g in sodium chloride 0.9 % 100 mL IVPB  Status:  Discontinued        2 g 200 mL/hr over 30 Minutes Intravenous Every 24 hours 03/16/21 2322 03/17/21 1744   03/17/21 1600  metroNIDAZOLE (FLAGYL) IVPB 500 mg  Status:  Discontinued        500 mg 100 mL/hr over 60 Minutes Intravenous Every 8 hours 03/17/21 1103 03/17/21 1744   03/17/21 0800  metroNIDAZOLE (FLAGYL) IVPB 500 mg  Status:  Discontinued        500 mg 100 mL/hr over 60 Minutes Intravenous Every 8 hours 03/16/21 2322 03/17/21 1107   03/16/21 2245  cefTRIAXone (ROCEPHIN)  2 g in sodium chloride 0.9 % 100 mL IVPB       "And" Linked Group Details   2 g 200 mL/hr over 30 Minutes Intravenous  Once 03/16/21 2243 03/17/21 0009   03/16/21 2245  metroNIDAZOLE (FLAGYL) IVPB 500 mg       "And" Linked Group Details   500 mg 100 mL/hr over 60 Minutes Intravenous  Once 03/16/21 2243 03/17/21 0108     Subjective: Patient was seen and examined at bedside.  Overnight events noted.   Patient reports having abdominal soreness.  Able to tolerate soft bland diet.  She still has abdominal distention, she is going to have CT abdomen and pelvis today.   Vitals:   03/21/21 1757  03/21/21 2201 03/22/21 0519 03/22/21 1435  BP: (!) 183/89 (!) 162/93 (!) 151/87 (!) 141/74  Pulse: 93 95 89 86  Resp: (!) 22 15 16 18   Temp: 98.1 F (36.7 C) 99.6 F (37.6 C) 98.9 F (37.2 C) 98.2 F (36.8 C)  TempSrc:  Oral Oral   SpO2: 100% 97% 96%   Weight:      Height:        Intake/Output Summary (Last 24 hours) at 03/22/2021 1550 Last data filed at 03/22/2021 0600 Gross per 24 hour  Intake 360 ml  Output --  Net 360 ml   Filed Weights   03/16/21 1937 03/17/21 1333  Weight: 59 kg 59 kg    Examination:  General exam: Appears calm and comfortable, not in any acute distress. Respiratory system: Clear to auscultation. Respiratory effort normal. Cardiovascular system: S1 & S2 heard, RRR. No JVD, murmurs, rubs, gallops or clicks. No pedal edema. Gastrointestinal system: Abdomen is distended, soft and but tender. No organomegaly or masses felt. Normal bowel sounds heard. Central nervous system: Alert and oriented x 3. No focal neurological deficits. Extremities: No edema, no cyanosis, no clubbing. Skin: No rashes, lesions or ulcers Psychiatry: Judgement and insight appear normal. Mood & affect appropriate.     Data Reviewed: I have personally reviewed following labs and imaging studies  CBC: Recent Labs  Lab 03/16/21 2040 03/17/21 0542 03/18/21 0513 03/20/21 0644 03/21/21 0515 03/22/21 0506  WBC 14.6* 14.7* 10.0 10.6* 8.2 10.6*  NEUTROABS 12.1*  --   --   --   --   --   HGB 14.4 12.9 12.3 12.2 11.3* 11.1*  HCT 42.2 38.5 37.4 36.2 33.7* 33.4*  MCV 94.0 97.2 97.7 94.8 94.4 94.4  PLT 259 238 221 240 239 440   Basic Metabolic Panel: Recent Labs  Lab 03/17/21 0542 03/18/21 0513 03/20/21 0644 03/21/21 0515 03/22/21 0506  NA 142 136 137 138 135  K 3.6 3.7 3.5 3.1* 3.2*  CL 109 101 100 100 101  CO2 25 27 25 26 24   GLUCOSE 157* 113* 100* 105* 115*  BUN 8 10 12 11 7   CREATININE 0.82 0.88 0.83 0.63 0.72  CALCIUM 8.8* 8.9 9.0 8.7* 8.4*  MG  --   --  2.2 2.0  --     GFR: Estimated Creatinine Clearance: 72.3 mL/min (by C-G formula based on SCr of 0.72 mg/dL). Liver Function Tests: Recent Labs  Lab 03/16/21 2040  AST 22  ALT 22  ALKPHOS 82  BILITOT 0.9  PROT 7.8  ALBUMIN 4.4   Recent Labs  Lab 03/16/21 2040  LIPASE 38   No results for input(s): AMMONIA in the last 168 hours. Coagulation Profile: No results for input(s): INR, PROTIME in the last 168 hours. Cardiac  Enzymes: No results for input(s): CKTOTAL, CKMB, CKMBINDEX, TROPONINI in the last 168 hours. BNP (last 3 results) No results for input(s): PROBNP in the last 8760 hours. HbA1C: No results for input(s): HGBA1C in the last 72 hours. CBG: Recent Labs  Lab 03/21/21 1201 03/21/21 1747 03/21/21 2346 03/22/21 0515 03/22/21 1202  GLUCAP 121* 102* 116* 124* 95   Lipid Profile: No results for input(s): CHOL, HDL, LDLCALC, TRIG, CHOLHDL, LDLDIRECT in the last 72 hours. Thyroid Function Tests: No results for input(s): TSH, T4TOTAL, FREET4, T3FREE, THYROIDAB in the last 72 hours. Anemia Panel: No results for input(s): VITAMINB12, FOLATE, FERRITIN, TIBC, IRON, RETICCTPCT in the last 72 hours. Sepsis Labs: No results for input(s): PROCALCITON, LATICACIDVEN in the last 168 hours.  Recent Results (from the past 240 hour(s))  Resp Panel by RT-PCR (Flu A&B, Covid) Nasopharyngeal Swab     Status: None   Collection Time: 03/16/21 10:37 PM   Specimen: Nasopharyngeal Swab; Nasopharyngeal(NP) swabs in vial transport medium  Result Value Ref Range Status   SARS Coronavirus 2 by RT PCR NEGATIVE NEGATIVE Final    Comment: (NOTE) SARS-CoV-2 target nucleic acids are NOT DETECTED.  The SARS-CoV-2 RNA is generally detectable in upper respiratory specimens during the acute phase of infection. The lowest concentration of SARS-CoV-2 viral copies this assay can detect is 138 copies/mL. A negative result does not preclude SARS-Cov-2 infection and should not be used as the sole basis for  treatment or other patient management decisions. A negative result may occur with  improper specimen collection/handling, submission of specimen other than nasopharyngeal swab, presence of viral mutation(s) within the areas targeted by this assay, and inadequate number of viral copies(<138 copies/mL). A negative result must be combined with clinical observations, patient history, and epidemiological information. The expected result is Negative.  Fact Sheet for Patients:  EntrepreneurPulse.com.au  Fact Sheet for Healthcare Providers:  IncredibleEmployment.be  This test is no t yet approved or cleared by the [place] FDA and  has been authorized for detection and/or diagnosis of SARS-CoV-2 by FDA under an Emergency Use Authorization (EUA). This EUA will remain  in effect (meaning this test can be used) for the duration of the COVID-19 declaration under Section 564(b)(1) of the Act, 21 U.S.C.section 360bbb-3(b)(1), unless the authorization is terminated  or revoked sooner.       Influenza A by PCR NEGATIVE NEGATIVE Final   Influenza B by PCR NEGATIVE NEGATIVE Final    Comment: (NOTE) The Xpert Xpress SARS-CoV-2/FLU/RSV plus assay is intended as an aid in the diagnosis of influenza from Nasopharyngeal swab specimens and should not be used as a sole basis for treatment. Nasal washings and aspirates are unacceptable for Xpert Xpress SARS-CoV-2/FLU/RSV testing.  Fact Sheet for Patients: EntrepreneurPulse.com.au  Fact Sheet for Healthcare Providers: IncredibleEmployment.be  This test is not yet approved or cleared by the [place] FDA and has been authorized for detection and/or diagnosis of SARS-CoV-2 by FDA under an Emergency Use Authorization (EUA). This EUA will remain in effect (meaning this test can be used) for the duration of the COVID-19 declaration under Section 564(b)(1) of the Act, 21  U.S.C. section 360bbb-3(b)(1), unless the authorization is terminated or revoked.  Performed at [facility], [place] [address], [place], [place] [address]    Radiology Studies: CT ABDOMEN PELVIS W CONTRAST  Result Date: 03/22/2021 CLINICAL DATA:  Abdominal pain and fever, postop abdominal pain following appendectomy. EXAM: CT ABDOMEN AND PELVIS WITH CONTRAST TECHNIQUE: Multidetector CT imaging of the abdomen  and pelvis was performed using the standard protocol following bolus administration of intravenous contrast. CONTRAST:  153mL OMNIPAQUE IOHEXOL 300 MG/ML  SOLN COMPARISON:  Previous exam of March 16, 2021. FINDINGS: Lower chest: Incidental imaging of the lung bases with small effusions and basilar atelectasis. Hepatobiliary: No focal, suspicious hepatic lesion. Portal vein is patent. SMV is patent. Gallbladder is normal. No biliary duct dilation. Pancreas: Normal, without mass, inflammation or ductal dilatation. Spleen: Normal spleen. Adrenals/Urinary Tract: Adrenal glands are normal. Symmetric renal enhancement. No hydronephrosis. Urinary bladder collapsed limiting assessment. Stomach/Bowel: Oral contrast media was administered passing beyond the proximal small bowel into the mid small bowel. This becomes more dilated as it passes distally into the proximal ileum where there is a gradual transition in the RIGHT lower quadrant. Dilute contrast passes beyond this location. The colon is collapsed distally. Small bowel loops are dilated proximally and less dilated distally though not entirely collapse. Maximal caliber nearing 4 cm of proximal small bowel loops. In the area of previous appendectomy is a fluid collection, tubular collection measuring 5.4 x 1.9 cm (image 55/2) overlying the RIGHT psoas muscle. There is an adjacent tubular enhancing structure with some internal calcification that may be part of the ovary based on the relationship to the RIGHT adnexa and the ovarian vein.  There is generalized stranding in this location which is adjacent to the site of bowel transition. No signs of pneumatosis. Small amounts of fluid are present in the LEFT pelvis nonfocal. No pneumoperitoneum. No signs of gas near the site of appendiceal ligation in the RIGHT lower quadrant near the cecum. Vascular/Lymphatic: Patent abdominal vasculature. There is no gastrohepatic or hepatoduodenal ligament lymphadenopathy. No retroperitoneal or mesenteric lymphadenopathy. No pelvic sidewall lymphadenopathy. Reproductive: The uterus based on a linear hypodense stripe appears to extend into the RIGHT pelvis rather than the LEFT. In the LEFT pelvis there is a heterogeneous area of masslike enhancement measuring 5.9 x 3.5 x 6.7 cm. This is not entirely clear. Particularly since CT shows limited assessment Other: Fluid collection in the RIGHT pelvis. Small amount of fluid in the LEFT hemipelvis measuring approximately 2 x 1.6 cm without peripheral enhancement. Minimal peripheral enhancement is demonstrated along the margin of the dominant collection. Scattered lymph nodes are present in the ileocolic mesentery compatible with inflammation/reactive changes. Musculoskeletal: No acute musculoskeletal process. Spinal degenerative changes. IMPRESSION: 1. Postoperative fluid collection in the RIGHT lower quadrant associated with proximal bowel dilation, suspect ileus in the setting of inflammatory collection/abscess. Correlate with signs of infection and suggest follow-up to exclude developing partial small bowel obstruction. 2. LEFT pelvic masslike area in the LEFT adnexa potentially ovarian mass. Suggest follow-up pelvic sonogram for further assessment. The uterus may actually extend into the RIGHT pelvis based on low-density "stripe" passing through an area in the RIGHT pelvis that appears to represent a small uterus. Solid ovarian neoplasm is not excluded. 3. Small amount of fluid also in the anterior LEFT pelvis may be  reactive fluid, attention on follow-up. These results will be called to the ordering clinician or representative by the Radiologist Assistant, and communication documented in the PACS or [employer]. Electronically Signed   By: Zetta Bills M.D.   On: 03/22/2021 14:16    Scheduled Meds: . acetaminophen (TYLENOL) oral liquid 160 mg/5 mL  650 mg Oral Q6H  . amLODipine  10 mg Oral Daily  . carvedilol  3.125 mg Oral BID WC  . docusate sodium  100 mg Oral BID  . enoxaparin (LOVENOX) injection  40 mg Subcutaneous Q24H  . folic acid  1 mg Oral Daily  . gabapentin  300 mg Oral TID  . multivitamin with minerals  1 tablet Oral Daily  . pneumococcal 23 valent vaccine  0.5 mL Intramuscular Tomorrow-1000  . thiamine  100 mg Oral Daily   Or  . thiamine  100 mg Intravenous Daily   Continuous Infusions: . lactated ringers 75 mL/hr at 03/20/21 1730  . methocarbamol (ROBAXIN) IV    . piperacillin-tazobactam 3.375 g (03/22/21 1515)     LOS: 5 days    Time spent: 25 mins.    Shawna Clamp, MD [facility]   If 7PM-7AM, please contact night-coverage

## 2021-03-22 NOTE — Progress Notes (Signed)
5 Days Post-Op  Subjective: CC: Patient with continued pain along her lower abdomen and periumbilical area.  She notes yesterday she had worsening pain and nausea with eating.  Nausea is constant.  Denies emesis.  Small amounts of flatus.  Reports liquidy "bile" from her bottom but no real BM.  Objective: Vital signs in last 24 hours: Temp:  [98.1 F (36.7 C)-99.6 F (37.6 C)] 98.9 F (37.2 C) (05/03 0519) Pulse Rate:  [89-95] 89 (05/03 0519) Resp:  [15-22] 16 (05/03 0519) BP: (151-183)/(87-93) 151/87 (05/03 0519) SpO2:  [96 %-100 %] 96 % (05/03 0519) Last BM Date: 03/22/21  Intake/Output from previous day: 05/02 0701 - 05/03 0700 In: 715 [P.O.:715] Out: -  Intake/Output this shift: No intake/output data recorded.  PE: Gen:  Alert, NAD, pleasant Card:  Reg Pulm:  CTAB, no W/R/R, effort normal Abd: Soft,ND, generalized lower abdominal tenderness greatest in the LLQ and near umbilicus without peritonitis. Slightly hypoactive BS.Incisions with glue intact appears well and are without drainage, bleeding, or signs of infection Ext:  No LE edema  Psych: A&Ox3  Skin: no rashes noted, warm and dry   Lab Results:  Recent Labs    03/21/21 0515 03/22/21 0506  WBC 8.2 10.6*  HGB 11.3* 11.1*  HCT 33.7* 33.4*  PLT 239 284   BMET Recent Labs    03/21/21 0515 03/22/21 0506  NA 138 135  K 3.1* 3.2*  CL 100 101  CO2 26 24  GLUCOSE 105* 115*  BUN 11 7  CREATININE 0.63 0.72  CALCIUM 8.7* 8.4*   PT/INR No results for input(s): LABPROT, INR in the last 72 hours. CMP     Component Value Date/Time   NA 135 03/22/2021 0506   K 3.2 (L) 03/22/2021 0506   CL 101 03/22/2021 0506   CO2 24 03/22/2021 0506   GLUCOSE 115 (H) 03/22/2021 0506   BUN 7 03/22/2021 0506   CREATININE 0.72 03/22/2021 0506   CALCIUM 8.4 (L) 03/22/2021 0506   PROT 7.8 03/16/2021 2040   ALBUMIN 4.4 03/16/2021 2040   AST 22 03/16/2021 2040   ALT 22 03/16/2021 2040   ALKPHOS 82 03/16/2021 2040    BILITOT 0.9 03/16/2021 2040   GFRNONAA >60 03/22/2021 0506   GFRAA >60 06/13/2015 2224   Lipase     Component Value Date/Time   LIPASE 38 03/16/2021 2040       Studies/Results: No results found.  Anti-infectives: Anti-infectives (From admission, onward)   Start     Dose/Rate Route Frequency Ordered Stop   03/18/21 0830  cefTRIAXone (ROCEPHIN) 2 g in sodium chloride 0.9 % 100 mL IVPB        2 g 200 mL/hr over 30 Minutes Intravenous Every 24 hours 03/18/21 0738     03/18/21 0830  metroNIDAZOLE (FLAGYL) IVPB 500 mg        500 mg 100 mL/hr over 60 Minutes Intravenous Every 8 hours 03/18/21 0738     03/17/21 2300  cefTRIAXone (ROCEPHIN) 2 g in sodium chloride 0.9 % 100 mL IVPB  Status:  Discontinued        2 g 200 mL/hr over 30 Minutes Intravenous Every 24 hours 03/16/21 2322 03/17/21 1744   03/17/21 1600  metroNIDAZOLE (FLAGYL) IVPB 500 mg  Status:  Discontinued        500 mg 100 mL/hr over 60 Minutes Intravenous Every 8 hours 03/17/21 1103 03/17/21 1744   03/17/21 0800  metroNIDAZOLE (FLAGYL) IVPB 500 mg  Status:  Discontinued  500 mg 100 mL/hr over 60 Minutes Intravenous Every 8 hours 03/16/21 2322 03/17/21 1107   03/16/21 2245  cefTRIAXone (ROCEPHIN) 2 g in sodium chloride 0.9 % 100 mL IVPB       "And" Linked Group Details   2 g 200 mL/hr over 30 Minutes Intravenous  Once 03/16/21 2243 03/17/21 0009   03/16/21 2245  metroNIDAZOLE (FLAGYL) IVPB 500 mg       "And" Linked Group Details   500 mg 100 mL/hr over 60 Minutes Intravenous  Once 03/16/21 2243 03/17/21 0108       Assessment/Plan HTN - presented with HTN urgency. Appreciate TRH assistance in medication adjustment. BP 151/87 overnight.  ETOH use- CIWA. TOC consult  Hx of Cocaine use  Perforated appendicitis with purulent appendicitis S/p Laparoscopic Appendectomy - Dr. Thermon Leyland - 03/17/21 - POD #5 - CT A/P today. Back down to NPO for now until CT scan results with worsening nausea and pain with PO  intake.  - Cont IV abx. Needs at least 7 day course - Mobilize - Pulm toilet  FEN - NPO, IVF, replace K 3.2 VTE - SCDs, Lovenox  ID - Rocephin/Flagyl 4/27 >>WBC 10.6.  Foley - None. Voiding Follow-Up - DOW   LOS: 5 days    Jillyn Ledger , [facility] Surgery 03/22/2021, 8:28 AM Please see Amion for pager number during day hours 7:00am-4:30pm

## 2021-03-23 LAB — BASIC METABOLIC PANEL
Anion gap: 9 (ref 5–15)
BUN: 6 mg/dL (ref 6–20)
CO2: 25 mmol/L (ref 22–32)
Calcium: 8.4 mg/dL — ABNORMAL LOW (ref 8.9–10.3)
Chloride: 101 mmol/L (ref 98–111)
Creatinine, Ser: 0.7 mg/dL (ref 0.44–1.00)
GFR, Estimated: >60 mL/min (ref >60)
Glucose, Bld: 101 mg/dL — ABNORMAL HIGH (ref 70–99)
Potassium: 3.1 mmol/L — ABNORMAL LOW (ref 3.5–5.1)
Sodium: 135 mmol/L (ref 135–145)

## 2021-03-23 LAB — CBC
HCT: 31.7 % — ABNORMAL LOW (ref 36.0–46.0)
Hemoglobin: 10.6 g/dL — ABNORMAL LOW (ref 12.0–15.0)
MCH: 31.7 pg (ref 26.0–34.0)
MCHC: 33.4 g/dL (ref 30.0–36.0)
MCV: 94.9 fL (ref 80.0–100.0)
Platelets: 334 10*3/uL (ref 150–400)
RBC: 3.34 MIL/uL — ABNORMAL LOW (ref 3.87–5.11)
RDW: 13 % (ref 11.5–15.5)
WBC: 11.6 10*3/uL — ABNORMAL HIGH (ref 4.0–10.5)
nRBC: 0 % (ref 0.0–0.2)

## 2021-03-23 LAB — GLUCOSE, CAPILLARY
Glucose-Capillary: 105 mg/dL — ABNORMAL HIGH (ref 70–99)
Glucose-Capillary: 114 mg/dL — ABNORMAL HIGH (ref 70–99)
Glucose-Capillary: 114 mg/dL — ABNORMAL HIGH (ref 70–99)

## 2021-03-23 MED ORDER — POTASSIUM CHLORIDE 20 MEQ PO PACK
40.0000 meq | PACK | Freq: Once | ORAL | Status: DC
  Filled 2021-03-23: qty 2

## 2021-03-23 MED ORDER — POTASSIUM CHLORIDE CRYS ER 20 MEQ PO TBCR
40.0000 meq | EXTENDED_RELEASE_TABLET | Freq: Once | ORAL | Status: AC
  Administered 2021-03-23: 40 meq via ORAL
  Filled 2021-03-23: qty 2

## 2021-03-23 MED ORDER — METOCLOPRAMIDE HCL 5 MG/ML IJ SOLN
10.0000 mg | Freq: Three times a day (TID) | INTRAMUSCULAR | Status: AC
  Administered 2021-03-23 – 2021-03-24 (×3): 10 mg via INTRAVENOUS
  Filled 2021-03-23 (×3): qty 2

## 2021-03-23 NOTE — Progress Notes (Signed)
6 Days Post-Op  Subjective: CC: Patient upset she is still in the hospital. Having lower abdominal pain that is worse on the L > R. This is stable from yesterday  She is tolerating cld but does not like the options. Still having constant nausea. No emesis. She is passing flatus. Small more formed bm yesterday. Mobilizing. Voiding.   Objective: Vital signs in last 24 hours: Temp:  [98.2 F (36.8 C)-99.3 F (37.4 C)] 98.4 F (36.9 C) (05/04 0426) Pulse Rate:  [86-87] 86 (05/04 0426) Resp:  [15-18] 15 (05/04 0426) BP: (141-151)/(74-96) 151/96 (05/04 0426) SpO2:  [98 %] 98 % (05/04 0426) Last BM Date: 03/22/21  Intake/Output from previous day: 05/03 0701 - 05/04 0700 In: 2882.9 [P.O.:1070; I.V.:1712.9; IV Piggyback:100] Out: -  Intake/Output this shift: No intake/output data recorded.  PE: Gen: Alert, NAD, pleasant Card: Reg Pulm: CTAB, no W/R/R, effort normal FXT:KWIO,XB,DZHGDJMEQAS lower abdominal tenderness greatest in the LLQ and near umbilicus without peritonitis. + BS.Incisions with glue intact appears well and are without drainage, bleeding, or signs of infection Ext: No LE edema Psych: A&Ox3  Skin: no rashes noted, warm and dry  Lab Results:  Recent Labs    03/22/21 0506 03/23/21 0500  WBC 10.6* 11.6*  HGB 11.1* 10.6*  HCT 33.4* 31.7*  PLT 284 334   BMET Recent Labs    03/22/21 0506 03/23/21 0500  NA 135 135  K 3.2* 3.1*  CL 101 101  CO2 24 25  GLUCOSE 115* 101*  BUN 7 6  CREATININE 0.72 0.70  CALCIUM 8.4* 8.4*   PT/INR No results for input(s): LABPROT, INR in the last 72 hours. CMP     Component Value Date/Time   NA 135 03/23/2021 0500   K 3.1 (L) 03/23/2021 0500   CL 101 03/23/2021 0500   CO2 25 03/23/2021 0500   GLUCOSE 101 (H) 03/23/2021 0500   BUN 6 03/23/2021 0500   CREATININE 0.70 03/23/2021 0500   CALCIUM 8.4 (L) 03/23/2021 0500   PROT 7.8 03/16/2021 2040   ALBUMIN 4.4 03/16/2021 2040   AST 22 03/16/2021 2040   ALT 22  03/16/2021 2040   ALKPHOS 82 03/16/2021 2040   BILITOT 0.9 03/16/2021 2040   GFRNONAA >60 03/23/2021 0500   GFRAA >60 06/13/2015 2224   Lipase     Component Value Date/Time   LIPASE 38 03/16/2021 2040       Studies/Results: CT ABDOMEN PELVIS W CONTRAST  Result Date: 03/22/2021 CLINICAL DATA:  Abdominal pain and fever, postop abdominal pain following appendectomy. EXAM: CT ABDOMEN AND PELVIS WITH CONTRAST TECHNIQUE: Multidetector CT imaging of the abdomen and pelvis was performed using the standard protocol following bolus administration of intravenous contrast. CONTRAST:  164mL OMNIPAQUE IOHEXOL 300 MG/ML  SOLN COMPARISON:  Previous exam of March 16, 2021. FINDINGS: Lower chest: Incidental imaging of the lung bases with small effusions and basilar atelectasis. Hepatobiliary: No focal, suspicious hepatic lesion. Portal vein is patent. SMV is patent. Gallbladder is normal. No biliary duct dilation. Pancreas: Normal, without mass, inflammation or ductal dilatation. Spleen: Normal spleen. Adrenals/Urinary Tract: Adrenal glands are normal. Symmetric renal enhancement. No hydronephrosis. Urinary bladder collapsed limiting assessment. Stomach/Bowel: Oral contrast media was administered passing beyond the proximal small bowel into the mid small bowel. This becomes more dilated as it passes distally into the proximal ileum where there is a gradual transition in the RIGHT lower quadrant. Dilute contrast passes beyond this location. The colon is collapsed distally. Small bowel loops are dilated  proximally and less dilated distally though not entirely collapse. Maximal caliber nearing 4 cm of proximal small bowel loops. In the area of previous appendectomy is a fluid collection, tubular collection measuring 5.4 x 1.9 cm (image 55/2) overlying the RIGHT psoas muscle. There is an adjacent tubular enhancing structure with some internal calcification that may be part of the ovary based on the relationship to the  RIGHT adnexa and the ovarian vein. There is generalized stranding in this location which is adjacent to the site of bowel transition. No signs of pneumatosis. Small amounts of fluid are present in the LEFT pelvis nonfocal. No pneumoperitoneum. No signs of gas near the site of appendiceal ligation in the RIGHT lower quadrant near the cecum. Vascular/Lymphatic: Patent abdominal vasculature. There is no gastrohepatic or hepatoduodenal ligament lymphadenopathy. No retroperitoneal or mesenteric lymphadenopathy. No pelvic sidewall lymphadenopathy. Reproductive: The uterus based on a linear hypodense stripe appears to extend into the RIGHT pelvis rather than the LEFT. In the LEFT pelvis there is a heterogeneous area of masslike enhancement measuring 5.9 x 3.5 x 6.7 cm. This is not entirely clear. Particularly since CT shows limited assessment Other: Fluid collection in the RIGHT pelvis. Small amount of fluid in the LEFT hemipelvis measuring approximately 2 x 1.6 cm without peripheral enhancement. Minimal peripheral enhancement is demonstrated along the margin of the dominant collection. Scattered lymph nodes are present in the ileocolic mesentery compatible with inflammation/reactive changes. Musculoskeletal: No acute musculoskeletal process. Spinal degenerative changes. IMPRESSION: 1. Postoperative fluid collection in the RIGHT lower quadrant associated with proximal bowel dilation, suspect ileus in the setting of inflammatory collection/abscess. Correlate with signs of infection and suggest follow-up to exclude developing partial small bowel obstruction. 2. LEFT pelvic masslike area in the LEFT adnexa potentially ovarian mass. Suggest follow-up pelvic sonogram for further assessment. The uterus may actually extend into the RIGHT pelvis based on low-density "stripe" passing through an area in the RIGHT pelvis that appears to represent a small uterus. Solid ovarian neoplasm is not excluded. 3. Small amount of fluid also in  the anterior LEFT pelvis may be reactive fluid, attention on follow-up. These results will be called to the ordering clinician or representative by the Radiologist Assistant, and communication documented in the PACS or [employer]. Electronically Signed   By: Zetta Bills M.D.   On: 03/22/2021 14:16    Anti-infectives: Anti-infectives (From admission, onward)   Start     Dose/Rate Route Frequency Ordered Stop   03/22/21 1400  piperacillin-tazobactam (ZOSYN) IVPB 3.375 g        3.375 g 12.5 mL/hr over 240 Minutes Intravenous Every 8 hours 03/22/21 1307     03/18/21 0830  cefTRIAXone (ROCEPHIN) 2 g in sodium chloride 0.9 % 100 mL IVPB  Status:  Discontinued        2 g 200 mL/hr over 30 Minutes Intravenous Every 24 hours 03/18/21 0738 03/22/21 1307   03/18/21 0830  metroNIDAZOLE (FLAGYL) IVPB 500 mg  Status:  Discontinued        500 mg 100 mL/hr over 60 Minutes Intravenous Every 8 hours 03/18/21 0738 03/22/21 1307   03/17/21 2300  cefTRIAXone (ROCEPHIN) 2 g in sodium chloride 0.9 % 100 mL IVPB  Status:  Discontinued        2 g 200 mL/hr over 30 Minutes Intravenous Every 24 hours 03/16/21 2322 03/17/21 1744   03/17/21 1600  metroNIDAZOLE (FLAGYL) IVPB 500 mg  Status:  Discontinued        500 mg 100 mL/hr  over 60 Minutes Intravenous Every 8 hours 03/17/21 1103 03/17/21 1744   03/17/21 0800  metroNIDAZOLE (FLAGYL) IVPB 500 mg  Status:  Discontinued        500 mg 100 mL/hr over 60 Minutes Intravenous Every 8 hours 03/16/21 2322 03/17/21 1107   03/16/21 2245  cefTRIAXone (ROCEPHIN) 2 g in sodium chloride 0.9 % 100 mL IVPB       "And" Linked Group Details   2 g 200 mL/hr over 30 Minutes Intravenous  Once 03/16/21 2243 03/17/21 0009   03/16/21 2245  metroNIDAZOLE (FLAGYL) IVPB 500 mg       "And" Linked Group Details   500 mg 100 mL/hr over 60 Minutes Intravenous  Once 03/16/21 2243 03/17/21 0108       Assessment/Plan HTN - presented with HTN urgency. Appreciate TRH assistancein  medication adjustment. Currently on amlodipine 10mg  QD, carvedilol 3.125mg  BID, and PRN hydralazine & labetalol.  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 Discussed with patient that this will need pelvic US and close follow up as an outpatient.   Perforated appendicitis with purulent appendicitis S/p Laparoscopic Appendectomy - Dr. Thermon Leyland - 03/17/21 - POD #6 -CT A/P 5/3 w/ 5.4 x 1.9 cm fluid collection near site of previous appendectomy overlying the right psoas muscle. Reviewed with MD and does not appear to be amenable to IR drainage (no window). There is also Changes to IV zosyn. May need repeat CT scan in near future.  - CT also with ileus. She is nauseated but tolerating cld w/o emesis and is passing flatus and having bm's. I will give fld for options but would not advance past this.  - Cont IV abx. Needs at least 7 day course - Mobilize - Pulm toilet  FEN - FLD, IVF, replace K 3.1 VTE - SCDs, Lovenox  ID - Rocephin/Flagyl 4/27 - 5/3. Zosyn 5/3 >> WBC 11.5 Foley - None. Voiding Follow-Up - DOW   LOS: 6 days    Jillyn Ledger , [facility] Surgery 03/23/2021, 10:36 AM Please see Amion for pager number during day hours 7:00am-4:30pm

## 2021-03-23 NOTE — Progress Notes (Addendum)
PROGRESS NOTE    Tracy Fischer  [id number] DOB: 02-22-1966 DOA: 03/16/2021 PCP: Pcp, No    Brief Narrative: This 55 years old female with PMH significant for essential hypertension , noncompliant with her medications, EtOH abuse, history of cocaine use admitted for acute appendicitis.  Patient was found to have perforated appendicitis with purulent appendicitis.  Patient underwent laparoscopic appendicectomy on 4/28.  Postoperatively developed ileus.  Started on IV antibiotics.  We were consulted for blood pressure management.  Assessment & Plan:   Principal Problem:   Acute appendicitis Active Problems:   Hypertensive urgency  Acute appendicitis:  CT abdomen shows perforated appendicitis with purulent drainage. Patient being followed by general surgery,  underwent laparoscopy appendicectomy on 4/28. Continue IV antibiotics as per surgery for 7 days. She was started on soft bland diet,  Patient has 2 episodes of vomiting.  Continued on soft bland diet. Continue zofran. Minimize narcotics.  If she continue to have pain consider CT scanning on postoperative day 5.   CT abdomen and pelvis showed postoperative fluid collection and postoperative ileus General surgery states collection is not amendable to IR drainage. Continue with antibiotics.  She may need repeat CT scan in few days.  Hypertensive urgency: > Improving Patient with history of hypertension,  noncompliance of medications. Blood pressure is now improved.  Started on low-dose Coreg. Blood pressure up again, added amlodipine 5 mg daily on 5/1. Increased amlodipine to 10 mg daily on 5/2  History of cocaine and alcohol abuse: Advised about quitting.   DVT prophylaxis:  Lovenox. Code Status:  Full code. Family Communication: No family at bed side. Disposition Plan:  Status is: Inpatient  Remains inpatient appropriate because:Inpatient level of care appropriate due to severity of illness   Dispo: The patient  is from: Home              Anticipated d/c is to: Home              Patient currently is not medically stable to d/c.   Difficult to place patient No   Consultants:    None  Procedures:   Laparoscopic appendicectomy  Antimicrobials:   Anti-infectives (From admission, onward)   Start     Dose/Rate Route Frequency Ordered Stop   03/22/21 1400  piperacillin-tazobactam (ZOSYN) IVPB 3.375 g        3.375 g 12.5 mL/hr over 240 Minutes Intravenous Every 8 hours 03/22/21 1307     03/18/21 0830  cefTRIAXone (ROCEPHIN) 2 g in sodium chloride 0.9 % 100 mL IVPB  Status:  Discontinued        2 g 200 mL/hr over 30 Minutes Intravenous Every 24 hours 03/18/21 0738 03/22/21 1307   03/18/21 0830  metroNIDAZOLE (FLAGYL) IVPB 500 mg  Status:  Discontinued        500 mg 100 mL/hr over 60 Minutes Intravenous Every 8 hours 03/18/21 0738 03/22/21 1307   03/17/21 2300  cefTRIAXone (ROCEPHIN) 2 g in sodium chloride 0.9 % 100 mL IVPB  Status:  Discontinued        2 g 200 mL/hr over 30 Minutes Intravenous Every 24 hours 03/16/21 2322 03/17/21 1744   03/17/21 1600  metroNIDAZOLE (FLAGYL) IVPB 500 mg  Status:  Discontinued        500 mg 100 mL/hr over 60 Minutes Intravenous Every 8 hours 03/17/21 1103 03/17/21 1744   03/17/21 0800  metroNIDAZOLE (FLAGYL) IVPB 500 mg  Status:  Discontinued        500 mg  100 mL/hr over 60 Minutes Intravenous Every 8 hours 03/16/21 2322 03/17/21 1107   03/16/21 2245  cefTRIAXone (ROCEPHIN) 2 g in sodium chloride 0.9 % 100 mL IVPB       "And" Linked Group Details   2 g 200 mL/hr over 30 Minutes Intravenous  Once 03/16/21 2243 03/17/21 0009   03/16/21 2245  metroNIDAZOLE (FLAGYL) IVPB 500 mg       "And" Linked Group Details   500 mg 100 mL/hr over 60 Minutes Intravenous  Once 03/16/21 2243 03/17/21 0108     Subjective: Patient was seen and examined at bedside.  Overnight events noted.   Patient reports having abdominal soreness.  Able to tolerate clear liquid diet. She  still has abdominal distention, CT abdomen shows collection but not amenable to IR drainage.   Vitals:   03/22/21 1435 03/22/21 2320 03/23/21 0426 03/23/21 1451  BP: (!) 141/74 (!) 145/83 (!) 151/96 (!) 142/93  Pulse: 86 87 86 78  Resp: 18 16 15 18   Temp: 98.2 F (36.8 C) 99.3 F (37.4 C) 98.4 F (36.9 C) 99 F (37.2 C)  TempSrc: Oral Oral Oral Oral  SpO2:  98% 98% 99%  Weight:      Height:        Intake/Output Summary (Last 24 hours) at 03/23/2021 1607 Last data filed at 03/23/2021 0600 Gross per 24 hour  Intake 2882.87 ml  Output --  Net 2882.87 ml   Filed Weights   03/16/21 1937 03/17/21 1333  Weight: 59 kg 59 kg    Examination:  General exam: Appears calm and comfortable, not in any acute distress. Respiratory system: Clear to auscultation. Respiratory effort normal. Cardiovascular system: S1 & S2 heard, RRR. No JVD, murmurs, rubs, gallops or clicks. No pedal edema. Gastrointestinal system: Abdomen is distended, soft and but tender. No organomegaly or masses felt. Normal bowel sounds heard. Central nervous system: Alert and oriented x 3. No focal neurological deficits. Extremities: No edema, no cyanosis, no clubbing. Skin: No rashes, lesions or ulcers Psychiatry: Judgement and insight appear normal. Mood & affect appropriate.     Data Reviewed: I have personally reviewed following labs and imaging studies  CBC: Recent Labs  Lab 03/16/21 2040 03/17/21 0542 03/18/21 0513 03/20/21 0644 03/21/21 0515 03/22/21 0506 03/23/21 0500  WBC 14.6*   < > 10.0 10.6* 8.2 10.6* 11.6*  NEUTROABS 12.1*  --   --   --   --   --   --   HGB 14.4   < > 12.3 12.2 11.3* 11.1* 10.6*  HCT 42.2   < > 37.4 36.2 33.7* 33.4* 31.7*  MCV 94.0   < > 97.7 94.8 94.4 94.4 94.9  PLT 259   < > 221 240 239 284 334   < > = values in this interval not displayed.   Basic Metabolic Panel: Recent Labs  Lab 03/18/21 0513 03/20/21 0644 03/21/21 0515 03/22/21 0506 03/23/21 0500  NA 136 137 138  135 135  K 3.7 3.5 3.1* 3.2* 3.1*  CL 101 100 100 101 101  CO2 27 25 26 24 25   GLUCOSE 113* 100* 105* 115* 101*  BUN 10 12 11 7 6   CREATININE 0.88 0.83 0.63 0.72 0.70  CALCIUM 8.9 9.0 8.7* 8.4* 8.4*  MG  --  2.2 2.0  --   --    GFR: Estimated Creatinine Clearance: 72.3 mL/min (by C-G formula based on SCr of 0.7 mg/dL). Liver Function Tests: Recent Labs  Lab 03/16/21 2040  AST 22  ALT 22  ALKPHOS 82  BILITOT 0.9  PROT 7.8  ALBUMIN 4.4   Recent Labs  Lab 03/16/21 2040  LIPASE 38   No results for input(s): AMMONIA in the last 168 hours. Coagulation Profile: No results for input(s): INR, PROTIME in the last 168 hours. Cardiac Enzymes: No results for input(s): CKTOTAL, CKMB, CKMBINDEX, TROPONINI in the last 168 hours. BNP (last 3 results) No results for input(s): PROBNP in the last 8760 hours. HbA1C: No results for input(s): HGBA1C in the last 72 hours. CBG: Recent Labs  Lab 03/22/21 0515 03/22/21 1202 03/22/21 2321 03/23/21 0427 03/23/21 1200  GLUCAP 124* 95 111* 105* 114*   Lipid Profile: No results for input(s): CHOL, HDL, LDLCALC, TRIG, CHOLHDL, LDLDIRECT in the last 72 hours. Thyroid Function Tests: No results for input(s): TSH, T4TOTAL, FREET4, T3FREE, THYROIDAB in the last 72 hours. Anemia Panel: No results for input(s): VITAMINB12, FOLATE, FERRITIN, TIBC, IRON, RETICCTPCT in the last 72 hours. Sepsis Labs: No results for input(s): PROCALCITON, LATICACIDVEN in the last 168 hours.  Recent Results (from the past 240 hour(s))  Resp Panel by RT-PCR (Flu A&B, Covid) Nasopharyngeal Swab     Status: None   Collection Time: 03/16/21 10:37 PM   Specimen: Nasopharyngeal Swab; Nasopharyngeal(NP) swabs in vial transport medium  Result Value Ref Range Status   SARS Coronavirus 2 by RT PCR NEGATIVE NEGATIVE Final    Comment: (NOTE) SARS-CoV-2 target nucleic acids are NOT DETECTED.  The SARS-CoV-2 RNA is generally detectable in upper respiratory specimens during  the acute phase of infection. The lowest concentration of SARS-CoV-2 viral copies this assay can detect is 138 copies/mL. A negative result does not preclude SARS-Cov-2 infection and should not be used as the sole basis for treatment or other patient management decisions. A negative result may occur with  improper specimen collection/handling, submission of specimen other than nasopharyngeal swab, presence of viral mutation(s) within the areas targeted by this assay, and inadequate number of viral copies(<138 copies/mL). A negative result must be combined with clinical observations, patient history, and epidemiological information. The expected result is Negative.  Fact Sheet for Patients:  EntrepreneurPulse.com.au  Fact Sheet for Healthcare Providers:  IncredibleEmployment.be  This test is no t yet approved or cleared by the [place] FDA and  has been authorized for detection and/or diagnosis of SARS-CoV-2 by FDA under an Emergency Use Authorization (EUA). This EUA will remain  in effect (meaning this test can be used) for the duration of the COVID-19 declaration under Section 564(b)(1) of the Act, 21 U.S.C.section 360bbb-3(b)(1), unless the authorization is terminated  or revoked sooner.       Influenza A by PCR NEGATIVE NEGATIVE Final   Influenza B by PCR NEGATIVE NEGATIVE Final    Comment: (NOTE) The Xpert Xpress SARS-CoV-2/FLU/RSV plus assay is intended as an aid in the diagnosis of influenza from Nasopharyngeal swab specimens and should not be used as a sole basis for treatment. Nasal washings and aspirates are unacceptable for Xpert Xpress SARS-CoV-2/FLU/RSV testing.  Fact Sheet for Patients: EntrepreneurPulse.com.au  Fact Sheet for Healthcare Providers: IncredibleEmployment.be  This test is not yet approved or cleared by the [place] FDA and has been authorized for detection and/or  diagnosis of SARS-CoV-2 by FDA under an Emergency Use Authorization (EUA). This EUA will remain in effect (meaning this test can be used) for the duration of the COVID-19 declaration under Section 564(b)(1) of the Act, 21 U.S.C. section 360bbb-3(b)(1), unless the authorization is terminated or revoked.  Performed at [facility], [place] [address] [place], [place] [address]    Radiology Studies: CT ABDOMEN PELVIS W CONTRAST  Result Date: 03/22/2021 CLINICAL DATA:  Abdominal pain and fever, postop abdominal pain following appendectomy. EXAM: CT ABDOMEN AND PELVIS WITH CONTRAST TECHNIQUE: Multidetector CT imaging of the abdomen and pelvis was performed using the standard protocol following bolus administration of intravenous contrast. CONTRAST:  154mL OMNIPAQUE IOHEXOL 300 MG/ML  SOLN COMPARISON:  Previous exam of March 16, 2021. FINDINGS: Lower chest: Incidental imaging of the lung bases with small effusions and basilar atelectasis. Hepatobiliary: No focal, suspicious hepatic lesion. Portal vein is patent. SMV is patent. Gallbladder is normal. No biliary duct dilation. Pancreas: Normal, without mass, inflammation or ductal dilatation. Spleen: Normal spleen. Adrenals/Urinary Tract: Adrenal glands are normal. Symmetric renal enhancement. No hydronephrosis. Urinary bladder collapsed limiting assessment. Stomach/Bowel: Oral contrast media was administered passing beyond the proximal small bowel into the mid small bowel. This becomes more dilated as it passes distally into the proximal ileum where there is a gradual transition in the RIGHT lower quadrant. Dilute contrast passes beyond this location. The colon is collapsed distally. Small bowel loops are dilated proximally and less dilated distally though not entirely collapse. Maximal caliber nearing 4 cm of proximal small bowel loops. In the area of previous appendectomy is a fluid collection, tubular collection measuring 5.4 x 1.9 cm (image  55/2) overlying the RIGHT psoas muscle. There is an adjacent tubular enhancing structure with some internal calcification that may be part of the ovary based on the relationship to the RIGHT adnexa and the ovarian vein. There is generalized stranding in this location which is adjacent to the site of bowel transition. No signs of pneumatosis. Small amounts of fluid are present in the LEFT pelvis nonfocal. No pneumoperitoneum. No signs of gas near the site of appendiceal ligation in the RIGHT lower quadrant near the cecum. Vascular/Lymphatic: Patent abdominal vasculature. There is no gastrohepatic or hepatoduodenal ligament lymphadenopathy. No retroperitoneal or mesenteric lymphadenopathy. No pelvic sidewall lymphadenopathy. Reproductive: The uterus based on a linear hypodense stripe appears to extend into the RIGHT pelvis rather than the LEFT. In the LEFT pelvis there is a heterogeneous area of masslike enhancement measuring 5.9 x 3.5 x 6.7 cm. This is not entirely clear. Particularly since CT shows limited assessment Other: Fluid collection in the RIGHT pelvis. Small amount of fluid in the LEFT hemipelvis measuring approximately 2 x 1.6 cm without peripheral enhancement. Minimal peripheral enhancement is demonstrated along the margin of the dominant collection. Scattered lymph nodes are present in the ileocolic mesentery compatible with inflammation/reactive changes. Musculoskeletal: No acute musculoskeletal process. Spinal degenerative changes. IMPRESSION: 1. Postoperative fluid collection in the RIGHT lower quadrant associated with proximal bowel dilation, suspect ileus in the setting of inflammatory collection/abscess. Correlate with signs of infection and suggest follow-up to exclude developing partial small bowel obstruction. 2. LEFT pelvic masslike area in the LEFT adnexa potentially ovarian mass. Suggest follow-up pelvic sonogram for further assessment. The uterus may actually extend into the RIGHT pelvis  based on low-density "stripe" passing through an area in the RIGHT pelvis that appears to represent a small uterus. Solid ovarian neoplasm is not excluded. 3. Small amount of fluid also in the anterior LEFT pelvis may be reactive fluid, attention on follow-up. These results will be called to the ordering clinician or representative by the Radiologist Assistant, and communication documented in the PACS or [employer]. Electronically Signed   By: Jewel Baize.D.  On: 03/22/2021 14:16    Scheduled Meds: . acetaminophen (TYLENOL) oral liquid 160 mg/5 mL  650 mg Oral Q6H  . amLODipine  10 mg Oral Daily  . carvedilol  3.125 mg Oral BID WC  . docusate sodium  100 mg Oral BID  . enoxaparin (LOVENOX) injection  40 mg Subcutaneous Q24H  . folic acid  1 mg Oral Daily  . gabapentin  300 mg Oral TID  . metoCLOPramide (REGLAN) injection  10 mg Intravenous Q8H  . multivitamin with minerals  1 tablet Oral Daily  . pneumococcal 23 valent vaccine  0.5 mL Intramuscular Tomorrow-1000  . potassium chloride  40 mEq Oral Once  . thiamine  100 mg Oral Daily   Or  . thiamine  100 mg Intravenous Daily   Continuous Infusions: . lactated ringers 75 mL/hr at 03/23/21 1128  . methocarbamol (ROBAXIN) IV    . piperacillin-tazobactam 3.375 g (03/23/21 0523)     LOS: 6 days    Time spent: 25 mins.    Shawna Clamp, MD [facility]   If 7PM-7AM, please contact night-coverage

## 2021-03-24 LAB — CBC
HCT: 33.2 % — ABNORMAL LOW (ref 36.0–46.0)
Hemoglobin: 11 g/dL — ABNORMAL LOW (ref 12.0–15.0)
MCH: 31.7 pg (ref 26.0–34.0)
MCHC: 33.1 g/dL (ref 30.0–36.0)
MCV: 95.7 fL (ref 80.0–100.0)
Platelets: 430 10*3/uL — ABNORMAL HIGH (ref 150–400)
RBC: 3.47 MIL/uL — ABNORMAL LOW (ref 3.87–5.11)
RDW: 13 % (ref 11.5–15.5)
WBC: 10.8 10*3/uL — ABNORMAL HIGH (ref 4.0–10.5)
nRBC: 0 % (ref 0.0–0.2)

## 2021-03-24 LAB — BASIC METABOLIC PANEL
Anion gap: 13 (ref 5–15)
BUN: 5 mg/dL — ABNORMAL LOW (ref 6–20)
CO2: 24 mmol/L (ref 22–32)
Calcium: 8.9 mg/dL (ref 8.9–10.3)
Chloride: 103 mmol/L (ref 98–111)
Creatinine, Ser: 0.76 mg/dL (ref 0.44–1.00)
GFR, Estimated: >60 mL/min (ref >60)
Glucose, Bld: 120 mg/dL — ABNORMAL HIGH (ref 70–99)
Potassium: 3.4 mmol/L — ABNORMAL LOW (ref 3.5–5.1)
Sodium: 140 mmol/L (ref 135–145)

## 2021-03-24 LAB — GLUCOSE, CAPILLARY
Glucose-Capillary: 121 mg/dL — ABNORMAL HIGH (ref 70–99)
Glucose-Capillary: 127 mg/dL — ABNORMAL HIGH (ref 70–99)
Glucose-Capillary: 96 mg/dL (ref 70–99)

## 2021-03-24 MED ORDER — ACETAMINOPHEN 500 MG PO TABS: 1000.0000 mg | ORAL_TABLET | Freq: Three times a day (TID) | ORAL | 0 refills | Status: AC | PRN

## 2021-03-24 MED ORDER — METHOCARBAMOL 500 MG PO TABS: 500.0000 mg | ORAL_TABLET | Freq: Three times a day (TID) | ORAL | 0 refills | Status: DC | PRN

## 2021-03-24 MED ORDER — DOCUSATE SODIUM 100 MG PO CAPS: 100.0000 mg | ORAL_CAPSULE | Freq: Two times a day (BID) | ORAL | 0 refills | Status: DC | PRN

## 2021-03-24 MED ORDER — AMOXICILLIN-POT CLAVULANATE 875-125 MG PO TABS: 1.0000 | ORAL_TABLET | Freq: Two times a day (BID) | ORAL | 0 refills | Status: DC

## 2021-03-24 MED ORDER — FOLIC ACID 1 MG PO TABS: 1.0000 mg | ORAL_TABLET | Freq: Every day | ORAL | Status: DC

## 2021-03-24 MED ORDER — POTASSIUM CHLORIDE CRYS ER 20 MEQ PO TBCR
40.0000 meq | EXTENDED_RELEASE_TABLET | Freq: Once | ORAL | Status: AC
  Administered 2021-03-24: 40 meq via ORAL
  Filled 2021-03-24: qty 2

## 2021-03-24 MED ORDER — ONDANSETRON HCL 4 MG PO TABS: 4.0000 mg | ORAL_TABLET | Freq: Four times a day (QID) | ORAL | 0 refills | Status: DC

## 2021-03-24 MED ORDER — CARVEDILOL 3.125 MG PO TABS: 3.1250 mg | ORAL_TABLET | Freq: Two times a day (BID) | ORAL | 1 refills | Status: DC

## 2021-03-24 MED ORDER — ADULT MULTIVITAMIN W/MINERALS CH: 1.0000 | ORAL_TABLET | Freq: Every day | ORAL | 0 refills | Status: DC

## 2021-03-24 MED ORDER — AMLODIPINE BESYLATE 10 MG PO TABS: 10.0000 mg | ORAL_TABLET | Freq: Every day | ORAL | 1 refills | Status: DC

## 2021-03-24 MED ORDER — OXYCODONE HCL 5 MG PO TABS: 5.0000 mg | ORAL_TABLET | Freq: Four times a day (QID) | ORAL | 0 refills | Status: DC | PRN

## 2021-03-24 MED ORDER — THIAMINE HCL 100 MG PO TABS: 100.0000 mg | ORAL_TABLET | Freq: Every day | ORAL | Status: DC

## 2021-03-24 NOTE — Progress Notes (Signed)
7 Days Post-Op  Subjective: CC: Seen with attending. Patient is doing better. Less lower abdominal pain/soreness. No n/v and tolerating fld. Passing flatus. Small but more formed bm yesterday.   Objective: Vital signs in last 24 hours: Temp:  [98.6 F (37 C)-99 F (37.2 C)] 98.7 F (37.1 C) (05/05 0328) Pulse Rate:  [78-89] 87 (05/05 0328) Resp:  [15-18] 15 (05/05 0328) BP: (142-167)/(82-93) 149/82 (05/05 0328) SpO2:  [98 %-100 %] 98 % (05/05 0328) Last BM Date: 03/23/21  Intake/Output from previous day: No intake/output data recorded. Intake/Output this shift: No intake/output data recorded.  PE: Gen: Alert, NAD, pleasant Pulm: Normal rate and effort  VHQ:IONG,EXBM distension, essential NT. + BS.Incisions with glue intact appears well and are without drainage, bleeding, or signs of infection Ext: No LE edema Psych: A&Ox3  Skin: no rashes noted, warm and dry  Lab Results:  Recent Labs    03/23/21 0500 03/24/21 0502  WBC 11.6* 10.8*  HGB 10.6* 11.0*  HCT 31.7* 33.2*  PLT 334 430*   BMET Recent Labs    03/23/21 0500 03/24/21 0502  NA 135 140  K 3.1* 3.4*  CL 101 103  CO2 25 24  GLUCOSE 101* 120*  BUN 6 5*  CREATININE 0.70 0.76  CALCIUM 8.4* 8.9   PT/INR No results for input(s): LABPROT, INR in the last 72 hours. CMP     Component Value Date/Time   NA 140 03/24/2021 0502   K 3.4 (L) 03/24/2021 0502   CL 103 03/24/2021 0502   CO2 24 03/24/2021 0502   GLUCOSE 120 (H) 03/24/2021 0502   BUN 5 (L) 03/24/2021 0502   CREATININE 0.76 03/24/2021 0502   CALCIUM 8.9 03/24/2021 0502   PROT 7.8 03/16/2021 2040   ALBUMIN 4.4 03/16/2021 2040   AST 22 03/16/2021 2040   ALT 22 03/16/2021 2040   ALKPHOS 82 03/16/2021 2040   BILITOT 0.9 03/16/2021 2040   GFRNONAA >60 03/24/2021 0502   GFRAA >60 06/13/2015 2224   Lipase     Component Value Date/Time   LIPASE 38 03/16/2021 2040       Studies/Results: CT ABDOMEN PELVIS W CONTRAST  Result  Date: 03/22/2021 CLINICAL DATA:  Abdominal pain and fever, postop abdominal pain following appendectomy. EXAM: CT ABDOMEN AND PELVIS WITH CONTRAST TECHNIQUE: Multidetector CT imaging of the abdomen and pelvis was performed using the standard protocol following bolus administration of intravenous contrast. CONTRAST:  138mL OMNIPAQUE IOHEXOL 300 MG/ML  SOLN COMPARISON:  Previous exam of March 16, 2021. FINDINGS: Lower chest: Incidental imaging of the lung bases with small effusions and basilar atelectasis. Hepatobiliary: No focal, suspicious hepatic lesion. Portal vein is patent. SMV is patent. Gallbladder is normal. No biliary duct dilation. Pancreas: Normal, without mass, inflammation or ductal dilatation. Spleen: Normal spleen. Adrenals/Urinary Tract: Adrenal glands are normal. Symmetric renal enhancement. No hydronephrosis. Urinary bladder collapsed limiting assessment. Stomach/Bowel: Oral contrast media was administered passing beyond the proximal small bowel into the mid small bowel. This becomes more dilated as it passes distally into the proximal ileum where there is a gradual transition in the RIGHT lower quadrant. Dilute contrast passes beyond this location. The colon is collapsed distally. Small bowel loops are dilated proximally and less dilated distally though not entirely collapse. Maximal caliber nearing 4 cm of proximal small bowel loops. In the area of previous appendectomy is a fluid collection, tubular collection measuring 5.4 x 1.9 cm (image 55/2) overlying the RIGHT psoas muscle. There is an adjacent tubular enhancing  structure with some internal calcification that may be part of the ovary based on the relationship to the RIGHT adnexa and the ovarian vein. There is generalized stranding in this location which is adjacent to the site of bowel transition. No signs of pneumatosis. Small amounts of fluid are present in the LEFT pelvis nonfocal. No pneumoperitoneum. No signs of gas near the site of  appendiceal ligation in the RIGHT lower quadrant near the cecum. Vascular/Lymphatic: Patent abdominal vasculature. There is no gastrohepatic or hepatoduodenal ligament lymphadenopathy. No retroperitoneal or mesenteric lymphadenopathy. No pelvic sidewall lymphadenopathy. Reproductive: The uterus based on a linear hypodense stripe appears to extend into the RIGHT pelvis rather than the LEFT. In the LEFT pelvis there is a heterogeneous area of masslike enhancement measuring 5.9 x 3.5 x 6.7 cm. This is not entirely clear. Particularly since CT shows limited assessment Other: Fluid collection in the RIGHT pelvis. Small amount of fluid in the LEFT hemipelvis measuring approximately 2 x 1.6 cm without peripheral enhancement. Minimal peripheral enhancement is demonstrated along the margin of the dominant collection. Scattered lymph nodes are present in the ileocolic mesentery compatible with inflammation/reactive changes. Musculoskeletal: No acute musculoskeletal process. Spinal degenerative changes. IMPRESSION: 1. Postoperative fluid collection in the RIGHT lower quadrant associated with proximal bowel dilation, suspect ileus in the setting of inflammatory collection/abscess. Correlate with signs of infection and suggest follow-up to exclude developing partial small bowel obstruction. 2. LEFT pelvic masslike area in the LEFT adnexa potentially ovarian mass. Suggest follow-up pelvic sonogram for further assessment. The uterus may actually extend into the RIGHT pelvis based on low-density "stripe" passing through an area in the RIGHT pelvis that appears to represent a small uterus. Solid ovarian neoplasm is not excluded. 3. Small amount of fluid also in the anterior LEFT pelvis may be reactive fluid, attention on follow-up. These results will be called to the ordering clinician or representative by the Radiologist Assistant, and communication documented in the PACS or [employer]. Electronically Signed   By: Zetta Bills M.D.   On: 03/22/2021 14:16    Anti-infectives: Anti-infectives (From admission, onward)   Start     Dose/Rate Route Frequency Ordered Stop   03/22/21 1400  piperacillin-tazobactam (ZOSYN) IVPB 3.375 g        3.375 g 12.5 mL/hr over 240 Minutes Intravenous Every 8 hours 03/22/21 1307     03/18/21 0830  cefTRIAXone (ROCEPHIN) 2 g in sodium chloride 0.9 % 100 mL IVPB  Status:  Discontinued        2 g 200 mL/hr over 30 Minutes Intravenous Every 24 hours 03/18/21 0738 03/22/21 1307   03/18/21 0830  metroNIDAZOLE (FLAGYL) IVPB 500 mg  Status:  Discontinued        500 mg 100 mL/hr over 60 Minutes Intravenous Every 8 hours 03/18/21 0738 03/22/21 1307   03/17/21 2300  cefTRIAXone (ROCEPHIN) 2 g in sodium chloride 0.9 % 100 mL IVPB  Status:  Discontinued        2 g 200 mL/hr over 30 Minutes Intravenous Every 24 hours 03/16/21 2322 03/17/21 1744   03/17/21 1600  metroNIDAZOLE (FLAGYL) IVPB 500 mg  Status:  Discontinued        500 mg 100 mL/hr over 60 Minutes Intravenous Every 8 hours 03/17/21 1103 03/17/21 1744   03/17/21 0800  metroNIDAZOLE (FLAGYL) IVPB 500 mg  Status:  Discontinued        500 mg 100 mL/hr over 60 Minutes Intravenous Every 8 hours 03/16/21 2322 03/17/21 1107  03/16/21 2245  cefTRIAXone (ROCEPHIN) 2 g in sodium chloride 0.9 % 100 mL IVPB       "And" Linked Group Details   2 g 200 mL/hr over 30 Minutes Intravenous  Once 03/16/21 2243 03/17/21 0009   03/16/21 2245  metroNIDAZOLE (FLAGYL) IVPB 500 mg       "And" Linked Group Details   500 mg 100 mL/hr over 60 Minutes Intravenous  Once 03/16/21 2243 03/17/21 0108       Assessment/Plan HTN - presented with HTN urgency. Appreciate TRH assistancein medication adjustment. Currently on amlodipine 10mg  QD, carvedilol 3.125mg  BID, and PRN hydralazine & labetalol.  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 We discussed with patient that this  will need pelvic US and close follow up as an outpatient. She does not have a GYN. Will get TOC involved to assist with this   Perforated appendicitis with purulent appendicitis S/p Laparoscopic Appendectomy - Dr. Thermon Leyland - 03/17/21 - POD #7 -CT A/P 5/3 w/ 5.4 x 1.9 cm fluid collection near site of previous appendectomy overlying the right psoas muscle. Reviewed with MD and does not appear to be amenable to IR drainage (no window). Changed to IV zosyn. WBC down. Pain improved.  - Tolerating fld and having bowel function. N/v resolved. Adv to soft diet.  - Cont abx - Mobilize - Pulm toilet  FEN - Soft, SLIV, replace K 3.4 VTE - SCDs, Lovenox  ID - Rocephin/Flagyl 4/27 - 5/3. Zosyn 5/3 >> WBC down at 10.8 Foley - None. Voiding Follow-Up - DOW   LOS: 7 days    Jillyn Ledger , [facility] 03/24/2021, 10:30 AM Please see Amion for pager number during day hours 7:00am-4:30pm

## 2021-03-24 NOTE — Discharge Instructions (Signed)
APPENDECTOMY POST OPERATIVE INSTRUCTIONS  Thinking Clearly  . The anesthesia may cause you to feel different for 1 or 2 days. Do not drive, drink alcohol, or make any big decisions for at least 2 days.  Nutrition . When you wake up, you will be able to drink small amounts of liquid. If you do not feel sick, you can slowly advance your diet to regular foods. . Continue to drink lots of fluids, usually about 8 to 10 glasses per day. . Eat a high-fiber diet so you don't strain during bowel movements. . High-Fiber Foods o Foods high in fiber include beans, bran cereals and whole-grain breads, peas, dried fruit (figs, apricots, and dates), raspberries, blackberries, strawberries, sweet corn, broccoli, baked potatoes with skin, plums, pears, apples, greens, and nuts. Activity . Slowly increase your activity. Be sure to get up and walk every hour or so to prevent blood clots. . No heavy lifting or strenuous activity for 4 weeks following surgery to prevent hernias at your incision sites . It is normal to feel tired. You may need more sleep than usual.  Get your rest but make sure to get up and move around frequently to prevent blood clots and pneumonia.  Work and Return to [employer] . You can go back to work when you feel well enough. Discuss the timing with your surgeon. . You can usually go back to school or work 1 week or less after an operation for an unruptured appendix and up to 2 weeks after a ruptured appendix. . If your work requires heavy lifting or strenuous activity you need to be placed on light duty for 4 weeks following surgery. . You can return to gym class, sports or other physical activities 4 weeks after surgery.  Wound Care . Always wash your hands before and after touching near your incision site. . Do not soak in a bathtub until cleared at your follow up appointment. You may take a shower 24 hours after surgery. . A small amount of drainage from the incision is normal. If the  drainage is thick and yellow or the site is red, you may have an infection, so call your surgeon. . If you have a drain in one of your incisions, it will be taken out in office when the drainage stops. . Steri-Strips will fall off in 7 to 10 days or they will be removed during your first office visit. . If you have dermabond glue covering over the incision, allow the glue to flake off on its own. Marland Kitchen Avoid wearing tight or rough clothing. It may rub your incisions and make it harder for them to heal. . Protect the new skin, especially from the sun. The sun can burn and cause darker scarring. . Your scar will heal in about 4 to 6 weeks and will become softer and continue to fade over the next year.  The cosmetic appearance of the incisions will improve over the course of the first year after surgery. . Sensation around your incision will return in a few weeks or months.  Bowel Movements . After intestinal surgery, you may have loose watery stools for several days. If watery diarrhea lasts longer than 3 days, contact your surgeon. . Pain medication (narcotics) can cause constipation. Increase the fiber in your diet with high-fiber foods if you are constipated. You can take an over the counter stool softener like Colace to avoid constipation.  Additional over the counter medications can also be used if Colace isn't  sufficient (for example, Milk of Magnesia or Miralax).  Pain . The amount of pain is different for each person. Some people need only 1 to 3 doses of pain control medication, while others need more. . Take alternating doses of tylenol and ibuprofen around the clock for the first five days following surgery.  This will provide a baseline of pain control and help with inflammation.  Take the narcotic pain medication in addition if needed for severe pain.  Contact Your Surgeon at [phone], if you have: . Pain that will not go away . Pain that gets worse . A fever of more than 101F  (38.3C) . Repeated vomiting . Swelling, redness, bleeding, or bad-smelling drainage from your wound site . Strong abdominal pain . No bowel movement or unable to pass gas for 3 days . Watery diarrhea lasting longer than 3 days  Pain Control . The goal of pain control is to minimize pain, keep you moving and help you heal. Your surgical team will work with you on your pain plan. Most often a combination of therapies and medications are used to control your pain. You may also be given medication (local anesthetic) at the surgical site. This may help control your pain for several days. . Extreme pain puts extra stress on your body at a time when your body needs to focus on healing. Do not wait until your pain has reached a level "10" or is unbearable before telling your doctor or nurse. It is much easier to control pain before it becomes severe. . Following a laparoscopic procedure, pain is sometimes felt in the shoulder. This is due to the gas inserted into your abdomen during the procedure. Moving and walking helps to decrease the gas and the right shoulder pain.  . Use the guide below for ways to manage your post-operative pain. Learn more by going to facs.org/safepaincontrol.  How Intense Is My Pain Common Therapies to Feel Better       I hardly notice my pain, and it does not interfere with my activities.  I notice my pain and it distracts me, but I can still do activities (sitting up, walking, standing).  Non-Medication Therapies  Ice (in a bag, applied over clothing at the surgical site), elevation, rest, meditation, massage, distraction (music, TV, play) walking and mild exercise Splinting the abdomen with pillows +  Non-Opioid Medications Acetaminophen (Tylenol) Non-steroidal anti-inflammatory drugs (NSAIDS) Aspirin, Ibuprofen (Motrin, Advil) Naproxen (Aleve) Take these as needed, when you feel pain. Both acetaminophen and NSAIDs help to decrease pain and swelling  (inflammation).      My pain is hard to ignore and is more noticeable even when I rest.  My pain interferes with my usual activities.  Non-Medication Therapies  +  Non-Opioid medications  Take on a regular schedule (around-the-clock) instead of as needed. (For example, Tylenol every 6 hours at 9:00 am, 3:00 pm, 9:00 pm, 3:00 am and Motrin every 6 hours at 12:00 am, 6:00 am, 12:00 pm, 6:00 pm)         I am focused on my pain, and I am not doing my daily activities.  I am groaning in pain, and I cannot sleep. I am unable to do anything.  My pain is as bad as it could be, and nothing else matters.  Non-Medication Therapies  +  Around-the-Clock Non-Opioid Medications  +  Short-acting opioids  Opioids should be used with other medications to manage severe pain. Opioids block pain and give a feeling of  euphoria (feel high). Addiction, a serious side effect of opioids, is rare with short-term (a few days) use.  Examples of short-acting opioids include: Tramadol (Ultram), Hydrocodone (Norco, Vicodin), Hydromorphone (Dilaudid), Oxycodone (Oxycontin)     The above directions have been adapted from the [employer] of [employer] Surgical Patient Education Program.  Please refer to the ACS website if needed: [email or web address]-moyer.[email or web address]   Ivar Drape, MD [facility] [address], [place], [place]  [address] ?  [address] [address], [place], [place]   [address] [phone] ? [phone] ? FAX [phone] Web site: [email or web address]  As we discussed, your CT found there is a LEFT pelvic masslike area in the LEFT adnexa potentially ovarian mass. A solid ovarian neoplasm could not excluded. It is recommended to have a Ultrasound done to evaluate this as soon as possible. Please follow up with your gynecologist to have this done.    Hypertension,  Adult Hypertension is another name for high blood pressure. High blood pressure forces your heart to work harder to pump blood. This can cause problems over time. There are two numbers in a blood pressure reading. There is a top number (systolic) over a bottom number (diastolic). It is best to have a blood pressure that is below 120/80. Healthy choices can help lower your blood pressure, or you may need medicine to help lower it. What are the causes? The cause of this condition is not known. Some conditions may be related to high blood pressure. What increases the risk?  Smoking.  Having type 2 diabetes mellitus, high cholesterol, or both.  Not getting enough exercise or physical activity.  Being overweight.  Having too much fat, sugar, calories, or salt (sodium) in your diet.  Drinking too much alcohol.  Having long-term (chronic) kidney disease.  Having a family history of high blood pressure.  Age. Risk increases with age.  Race. You may be at higher risk if you are African American.  Gender. Men are at higher risk than women before age 60. After age 65, women are at higher risk than men.  Having obstructive sleep apnea.  Stress. What are the signs or symptoms?  High blood pressure may not cause symptoms. Very high blood pressure (hypertensive crisis) may cause: ? Headache. ? Feelings of worry or nervousness (anxiety). ? Shortness of breath. ? Nosebleed. ? A feeling of being sick to your stomach (nausea). ? Throwing up (vomiting). ? Changes in how you see. ? Very bad chest pain. ? Seizures. How is this treated?  This condition is treated by making healthy lifestyle changes, such as: ? Eating healthy foods. ? Exercising more. ? Drinking less alcohol.  Your health care provider may prescribe medicine if lifestyle changes are not enough to get your blood pressure under control, and if: ? Your top number is above 130. ? Your bottom number is above 80.  Your  personal target blood pressure may vary. Follow these instructions at home: Eating and drinking  If told, follow the DASH eating plan. To follow this plan: ? Fill one half of your plate at each meal with fruits and vegetables. ? Fill one fourth of your plate at each meal with whole grains. Whole grains include whole-wheat pasta, brown rice, and whole-grain bread. ? Eat or drink low-fat dairy products, such as skim milk or low-fat yogurt. ? Fill one fourth of your plate at each meal with low-fat (lean) proteins. Low-fat proteins include fish, chicken without skin, eggs, beans, and tofu. ?  Avoid fatty meat, cured and processed meat, or chicken with skin. ? Avoid pre-made or processed food.  Eat less than 1,500 mg of salt each day.  Do not drink alcohol if: ? Your doctor tells you not to drink. ? You are pregnant, may be pregnant, or are planning to become pregnant.  If you drink alcohol: ? Limit how much you use to:  0-1 drink a day for women.  0-2 drinks a day for men. ? Be aware of how much alcohol is in your drink. In the [place] one drink equals one 12 oz bottle of beer (355 mL), one 5 oz glass of wine (148 mL), or one 1 oz glass of hard liquor (44 mL).   Lifestyle  Work with your doctor to stay at a healthy weight or to lose weight. Ask your doctor what the best weight is for you.  Get at least 30 minutes of exercise most days of the week. This may include walking, swimming, or biking.  Get at least 30 minutes of exercise that strengthens your muscles (resistance exercise) at least 3 days a week. This may include lifting weights or doing Pilates.  Do not use any products that contain nicotine or tobacco, such as cigarettes, e-cigarettes, and chewing tobacco. If you need help quitting, ask your doctor.  Check your blood pressure at home as told by your doctor.  Keep all follow-up visits as told by your doctor. This is important.   Medicines  Take over-the-counter and  prescription medicines only as told by your doctor. Follow directions carefully.  Do not skip doses of blood pressure medicine. The medicine does not work as well if you skip doses. Skipping doses also puts you at risk for problems.  Ask your doctor about side effects or reactions to medicines that you should watch for. Contact a doctor if you:  Think you are having a reaction to the medicine you are taking.  Have headaches that keep coming back (recurring).  Feel dizzy.  Have swelling in your ankles.  Have trouble with your vision. Get help right away if you:  Get a very bad headache.  Start to feel mixed up (confused).  Feel weak or numb.  Feel faint.  Have very bad pain in your: ? Chest. ? Belly (abdomen).  Throw up more than once.  Have trouble breathing. Summary  Hypertension is another name for high blood pressure.  High blood pressure forces your heart to work harder to pump blood.  For most people, a normal blood pressure is less than 120/80.  Making healthy choices can help lower blood pressure. If your blood pressure does not get lower with healthy choices, you may need to take medicine. This information is not intended to replace advice given to you by your health care provider. Make sure you discuss any questions you have with your health care provider. Document Revised: 07/17/2018 Document Reviewed: 07/17/2018 Elsevier Patient Education  2021 [employer].   Blood Pressure Record Sheet To take your blood pressure, you will need a blood pressure machine. You can buy a blood pressure machine (blood pressure monitor) at your clinic, drug store, or online. When choosing one, consider:  An automatic monitor that has an arm cuff.  A cuff that wraps snugly around your upper arm. You should be able to fit only one finger between your arm and the cuff.  A device that stores blood pressure reading results.  Do not choose a monitor that measures your blood  pressure from your wrist or finger. Follow your health care provider's instructions for how to take your blood pressure. To use this form:  Get one reading in the morning (a.m.) before you take any medicines.  Get one reading in the evening (p.m.) before supper.  Take at least 2 readings with each blood pressure check. This makes sure the results are correct. Wait 1-2 minutes between measurements.  Write down the results in the spaces on this form.  Repeat this once a week, or as told by your health care provider.  Make a follow-up appointment with your health care provider to discuss the results. Blood pressure log Date: _______________________  a.m. _____________________(1st reading) _____________________(2nd reading)  p.m. _____________________(1st reading) _____________________(2nd reading) Date: _______________________  a.m. _____________________(1st reading) _____________________(2nd reading)  p.m. _____________________(1st reading) _____________________(2nd reading) Date: _______________________  a.m. _____________________(1st reading) _____________________(2nd reading)  p.m. _____________________(1st reading) _____________________(2nd reading) Date: _______________________  a.m. _____________________(1st reading) _____________________(2nd reading)  p.m. _____________________(1st reading) _____________________(2nd reading) Date: _______________________  a.m. _____________________(1st reading) _____________________(2nd reading)  p.m. _____________________(1st reading) _____________________(2nd reading) This information is not intended to replace advice given to you by your health care provider. Make sure you discuss any questions you have with your health care provider. Document Revised: 02/25/2020 Document Reviewed: 02/25/2020 Elsevier Patient Education  2021 [employer].

## 2021-03-24 NOTE — Progress Notes (Signed)
PROGRESS NOTE    Tracy Fischer  [id number] DOB: 1966/03/18 DOA: 03/16/2021 PCP: Pcp, No    Brief Narrative: This 55 years old female with PMH significant for essential hypertension , noncompliant with her medications, EtOH abuse, history of cocaine use admitted for acute appendicitis.  Patient was found to have perforated appendicitis with purulent appendicitis.  Patient underwent laparoscopic appendicectomy on 4/28.  Postoperatively developed ileus.  Started on IV antibiotics.  We were consulted for blood pressure management.  Assessment & Plan:   Principal Problem:   Acute appendicitis Active Problems:   Hypertensive urgency  Acute appendicitis:  CT abdomen shows perforated appendicitis with purulent drainage. Patient being followed by general surgery,  underwent laparoscopy appendicectomy on 4/28. Continue IV antibiotics as per surgery for 7 days. She was started on soft bland diet,  Patient has 2 episodes of vomiting.  Continued on soft bland diet. Continue zofran. Minimize narcotics.  If she continue to have pain consider CT scanning on postoperative day 5.   CT abdomen and pelvis showed postoperative fluid collection and postoperative ileus General surgery states collection is not amendable to IR drainage. Continue with antibiotics.  She may need repeat CT scan in few days.  Hypertensive urgency: > Improved Patient with history of hypertension,  noncompliance of medications. Blood pressure is now improved.  Started on low-dose Coreg. Blood pressure up again, added amlodipine 5 mg daily on 5/1. Increased amlodipine to 10 mg daily on 5/2. Continue amlodipine 10 mg daily and coreg 3.125 mg BID.  History of cocaine and alcohol abuse: Advised about quitting.  We will continue to follow if patient remains inpatient, we are signing out if she is discharged. Advised to follow up PCP for BP follow up  DVT prophylaxis:  Lovenox. Code Status:  Full code. Family  Communication: No family at bed side. Disposition Plan:  Status is: Inpatient  Remains inpatient appropriate because:Inpatient level of care appropriate due to severity of illness   Dispo: The patient is from: Home              Anticipated d/c is to: Home              Patient currently is not medically stable to d/c.   Difficult to place patient No   Consultants:    None  Procedures:   Laparoscopic appendicectomy  Antimicrobials:   Anti-infectives (From admission, onward)   Start     Dose/Rate Route Frequency Ordered Stop   03/22/21 1400  piperacillin-tazobactam (ZOSYN) IVPB 3.375 g        3.375 g 12.5 mL/hr over 240 Minutes Intravenous Every 8 hours 03/22/21 1307     03/18/21 0830  cefTRIAXone (ROCEPHIN) 2 g in sodium chloride 0.9 % 100 mL IVPB  Status:  Discontinued        2 g 200 mL/hr over 30 Minutes Intravenous Every 24 hours 03/18/21 0738 03/22/21 1307   03/18/21 0830  metroNIDAZOLE (FLAGYL) IVPB 500 mg  Status:  Discontinued        500 mg 100 mL/hr over 60 Minutes Intravenous Every 8 hours 03/18/21 0738 03/22/21 1307   03/17/21 2300  cefTRIAXone (ROCEPHIN) 2 g in sodium chloride 0.9 % 100 mL IVPB  Status:  Discontinued        2 g 200 mL/hr over 30 Minutes Intravenous Every 24 hours 03/16/21 2322 03/17/21 1744   03/17/21 1600  metroNIDAZOLE (FLAGYL) IVPB 500 mg  Status:  Discontinued        500 mg  100 mL/hr over 60 Minutes Intravenous Every 8 hours 03/17/21 1103 03/17/21 1744   03/17/21 0800  metroNIDAZOLE (FLAGYL) IVPB 500 mg  Status:  Discontinued        500 mg 100 mL/hr over 60 Minutes Intravenous Every 8 hours 03/16/21 2322 03/17/21 1107   03/16/21 2245  cefTRIAXone (ROCEPHIN) 2 g in sodium chloride 0.9 % 100 mL IVPB       "And" Linked Group Details   2 g 200 mL/hr over 30 Minutes Intravenous  Once 03/16/21 2243 03/17/21 0009   03/16/21 2245  metroNIDAZOLE (FLAGYL) IVPB 500 mg       "And" Linked Group Details   500 mg 100 mL/hr over 60 Minutes Intravenous   Once 03/16/21 2243 03/17/21 0108     Subjective: Patient was seen and examined at bedside.  Overnight events noted.   Patient reports abdominal pain has significantly improved, she is able to tolerate soft blend diet. CT abdomen shows collection but not amenable to IR drainage. She reports that she might be discharged today.   Vitals:   03/23/21 0426 03/23/21 1451 03/23/21 2031 03/24/21 0328  BP: (!) 151/96 (!) 142/93 (!) 167/90 (!) 149/82  Pulse: 86 78 89 87  Resp: 15 18 15 15   Temp: 98.4 F (36.9 C) 99 F (37.2 C) 98.6 F (37 C) 98.7 F (37.1 C)  TempSrc: Oral Oral Oral Oral  SpO2: 98% 99% 100% 98%  Weight:      Height:       No intake or output data in the 24 hours ending 03/24/21 1418 Filed Weights   03/16/21 1937 03/17/21 1333  Weight: 59 kg 59 kg    Examination:  General exam: Appears calm and comfortable, not in any acute distress. Respiratory system: Clear to auscultation. Respiratory effort normal. Cardiovascular system: S1 & S2 heard, RRR. No JVD, murmurs, rubs, gallops or clicks. No pedal edema. Gastrointestinal system: Abdomen is not distended, soft , mild tenderness+ .  No organomegaly or masses felt. Normal bowel sounds heard. Central nervous system: Alert and oriented x 3. No focal neurological deficits. Extremities: No edema, no cyanosis, no clubbing. Skin: No rashes, lesions or ulcers Psychiatry: Judgement and insight appear normal. Mood & affect appropriate.     Data Reviewed: I have personally reviewed following labs and imaging studies  CBC: Recent Labs  Lab 03/20/21 0644 03/21/21 0515 03/22/21 0506 03/23/21 0500 03/24/21 0502  WBC 10.6* 8.2 10.6* 11.6* 10.8*  HGB 12.2 11.3* 11.1* 10.6* 11.0*  HCT 36.2 33.7* 33.4* 31.7* 33.2*  MCV 94.8 94.4 94.4 94.9 95.7  PLT 240 239 284 334 240*   Basic Metabolic Panel: Recent Labs  Lab 03/20/21 0644 03/21/21 0515 03/22/21 0506 03/23/21 0500 03/24/21 0502  NA 137 138 135 135 140  K 3.5 3.1*  3.2* 3.1* 3.4*  CL 100 100 101 101 103  CO2 25 26 24 25 24   GLUCOSE 100* 105* 115* 101* 120*  BUN 12 11 7 6  5*  CREATININE 0.83 0.63 0.72 0.70 0.76  CALCIUM 9.0 8.7* 8.4* 8.4* 8.9  MG 2.2 2.0  --   --   --    GFR: Estimated Creatinine Clearance: 72.3 mL/min (by C-G formula based on SCr of 0.76 mg/dL). Liver Function Tests: No results for input(s): AST, ALT, ALKPHOS, BILITOT, PROT, ALBUMIN in the last 168 hours. No results for input(s): LIPASE, AMYLASE in the last 168 hours. No results for input(s): AMMONIA in the last 168 hours. Coagulation Profile: No results for input(s): INR,  PROTIME in the last 168 hours. Cardiac Enzymes: No results for input(s): CKTOTAL, CKMB, CKMBINDEX, TROPONINI in the last 168 hours. BNP (last 3 results) No results for input(s): PROBNP in the last 8760 hours. HbA1C: No results for input(s): HGBA1C in the last 72 hours. CBG: Recent Labs  Lab 03/23/21 1200 03/23/21 1639 03/24/21 0022 03/24/21 0548 03/24/21 1153  GLUCAP 114* 114* 96 127* 121*   Lipid Profile: No results for input(s): CHOL, HDL, LDLCALC, TRIG, CHOLHDL, LDLDIRECT in the last 72 hours. Thyroid Function Tests: No results for input(s): TSH, T4TOTAL, FREET4, T3FREE, THYROIDAB in the last 72 hours. Anemia Panel: No results for input(s): VITAMINB12, FOLATE, FERRITIN, TIBC, IRON, RETICCTPCT in the last 72 hours. Sepsis Labs: No results for input(s): PROCALCITON, LATICACIDVEN in the last 168 hours.  Recent Results (from the past 240 hour(s))  Resp Panel by RT-PCR (Flu A&B, Covid) Nasopharyngeal Swab     Status: None   Collection Time: 03/16/21 10:37 PM   Specimen: Nasopharyngeal Swab; Nasopharyngeal(NP) swabs in vial transport medium  Result Value Ref Range Status   SARS Coronavirus 2 by RT PCR NEGATIVE NEGATIVE Final    Comment: (NOTE) SARS-CoV-2 target nucleic acids are NOT DETECTED.  The SARS-CoV-2 RNA is generally detectable in upper respiratory specimens during the acute phase of  infection. The lowest concentration of SARS-CoV-2 viral copies this assay can detect is 138 copies/mL. A negative result does not preclude SARS-Cov-2 infection and should not be used as the sole basis for treatment or other patient management decisions. A negative result may occur with  improper specimen collection/handling, submission of specimen other than nasopharyngeal swab, presence of viral mutation(s) within the areas targeted by this assay, and inadequate number of viral copies(<138 copies/mL). A negative result must be combined with clinical observations, patient history, and epidemiological information. The expected result is Negative.  Fact Sheet for Patients:  EntrepreneurPulse.com.au  Fact Sheet for Healthcare Providers:  IncredibleEmployment.be  This test is no t yet approved or cleared by the [place] FDA and  has been authorized for detection and/or diagnosis of SARS-CoV-2 by FDA under an Emergency Use Authorization (EUA). This EUA will remain  in effect (meaning this test can be used) for the duration of the COVID-19 declaration under Section 564(b)(1) of the Act, 21 U.S.C.section 360bbb-3(b)(1), unless the authorization is terminated  or revoked sooner.       Influenza A by PCR NEGATIVE NEGATIVE Final   Influenza B by PCR NEGATIVE NEGATIVE Final    Comment: (NOTE) The Xpert Xpress SARS-CoV-2/FLU/RSV plus assay is intended as an aid in the diagnosis of influenza from Nasopharyngeal swab specimens and should not be used as a sole basis for treatment. Nasal washings and aspirates are unacceptable for Xpert Xpress SARS-CoV-2/FLU/RSV testing.  Fact Sheet for Patients: EntrepreneurPulse.com.au  Fact Sheet for Healthcare Providers: IncredibleEmployment.be  This test is not yet approved or cleared by the [place] FDA and has been authorized for detection and/or diagnosis of SARS-CoV-2  by FDA under an Emergency Use Authorization (EUA). This EUA will remain in effect (meaning this test can be used) for the duration of the COVID-19 declaration under Section 564(b)(1) of the Act, 21 U.S.C. section 360bbb-3(b)(1), unless the authorization is terminated or revoked.  Performed at [facility], [place] [address] [place], [place] [address]    Radiology Studies: No results found.  Scheduled Meds: . acetaminophen (TYLENOL) oral liquid 160 mg/5 mL  650 mg Oral Q6H  . amLODipine  10 mg Oral Daily  .  carvedilol  3.125 mg Oral BID WC  . docusate sodium  100 mg Oral BID  . enoxaparin (LOVENOX) injection  40 mg Subcutaneous Q24H  . folic acid  1 mg Oral Daily  . gabapentin  300 mg Oral TID  . multivitamin with minerals  1 tablet Oral Daily  . pneumococcal 23 valent vaccine  0.5 mL Intramuscular Tomorrow-1000  . thiamine  100 mg Oral Daily   Or  . thiamine  100 mg Intravenous Daily   Continuous Infusions: . lactated ringers 75 mL/hr at 03/23/21 1128  . methocarbamol (ROBAXIN) IV    . piperacillin-tazobactam 3.375 g (03/24/21 0544)     LOS: 7 days    Time spent: 25 mins.    Shawna Clamp, MD [facility]   If 7PM-7AM, please contact night-coverage

## 2021-03-24 NOTE — TOC Transition Note (Signed)
Transition of Care [facility]) - CM/SW Discharge Note   Patient Details  Name: Tracy Fischer MRN: [id number] Date of Birth: October 17, 1966  [facility] CM/SW Contact:  Trish Mage, LCSW Phone Number: 03/24/2021, 3:10 PM   Clinical Narrative:  Patient seen in follow up to surgery request for PCP, help with medications.  CSW secured appointment for patient at [facility].  She was also given coupons for prescribed medications which she will pick up at [facility].  She voiced ability to pay for medications at the rate indicated on coupons.  No further needs identified. TOC sign off.    Final next level of care: Home/Self Care Barriers to Discharge: No Barriers Identified   Patient Goals and CMS Choice        Discharge Placement                       Discharge Plan and Services                                     Social Determinants of Health (SDOH) Interventions     Readmission Risk Interventions No flowsheet data found.

## 2021-03-29 NOTE — Discharge Summary (Signed)
Patient ID: Tracy Fischer [id number] [birth year] 55 y.o.  Admit date: 03/16/2021 Discharge date: 03/29/2021  Admitting Diagnosis: Acute Appendicitis  HTN urgency  Discharge Diagnosis Perforated appendicitis with purulent appendicitis s/p Laparoscopic Appendectomy  HTN ETOH use Hx of Cocaine use LEFT pelvic masslike area in the LEFT adnexa potentially ovarian mass Patient Active Problem List   Diagnosis Date Noted  . Acute appendicitis 03/16/2021  . Hypertensive urgency 03/16/2021   Consultants [facility]  Procedures Laparoscopic appendectomy - Dr. Thermon Leyland - 03/17/21  Hospital Course:  Tracy Fischer is a 55 y.o. female w/ a hx of HTN who presented with 3 days of abdominal pain and emesis. She reports that she had not been taking her blood pressure medications at home. She was found to be in HTN urgency, was admitted to medicine and started on IV labetalol. She was also found to have acute appendicitis on CT. He was started on abx and taken to the OR on 4/28 by Dr. Thermon Leyland for Laparoscopic appendectomy.  Intra-op she was found to have perforated appendicitis with purulent appendicitis. Patient tolerated the procedure well. She remained on abx post op. After surgery her BP had improved and surgery took over as primary. TRH continued to follow for BP control and medication adjustments. Patient developed an ileus post op.  A CT was obtained on 5/3 that showed a 5.4 x 1.9 cmfluid collection near site of previous appendectomy overlying the right psoas muscle. This was reviewed with MD and did not appear to be amenable to IR drainage (no window). Her abx were changed to Zosyn from Rocephin/Flagyl with improvement of wbc and pain. Patient had return of bowel function and her diet was advanced and tolerated. On 5/5, the patient was voiding well, tolerating diet, ambulating well, pain well controlled, vital signs stable, incisions c/d/i and felt stable for discharge home. Follow  up as noted below. Discharge instructions and return precautions discussed.   To note - there was a LEFT pelvic masslike area in the LEFT adnexa potentially ovarian massthat was noted on CT. I discussed with patient that this will need pelvic US and close follow up as an outpatient as this is concerning for neoplasm/cancer. She was able to verbally confirm she understood the importance of follow up and risks of not getting this evaluated. She does not have a GYN. TOC arranged follow up with PCP for HTN follow up and to obtain referral to GYN as outpatient. I have also given information to women's outpatient clinic. TRH provided scripts for BP meds at d/c.   Allergies as of 03/24/2021   No Known Allergies     Medication List    STOP taking these medications   aspirin-sod bicarb-citric acid 325 MG Tbef tablet Commonly known as: ALKA-SELTZER   ibuprofen 200 MG tablet Commonly known as: ADVIL     TAKE these medications   acetaminophen 500 MG tablet Commonly known as: TYLENOL Take 2 tablets (1,000 mg total) by mouth every 8 (eight) hours as needed for mild pain. What changed: when to take this   amLODipine 10 MG tablet Commonly known as: NORVASC Take 1 tablet (10 mg total) by mouth daily.   amoxicillin-clavulanate 875-125 MG tablet Commonly known as: AUGMENTIN Take 1 tablet by mouth every 12 (twelve) hours.   aspirin 325 MG tablet Take 325 mg by mouth every 4 (four) hours as needed for moderate pain.   bismuth subsalicylate 237 SE/83TD suspension Commonly known as: PEPTO BISMOL Take 30 mLs by mouth  every 6 (six) hours as needed. For upset stomach   carvedilol 3.125 MG tablet Commonly known as: COREG Take 1 tablet (3.125 mg total) by mouth 2 (two) times daily with a meal.   docusate sodium 100 MG capsule Commonly known as: COLACE Take 1 capsule (100 mg total) by mouth 2 (two) times daily as needed for mild constipation.   folic acid 1 MG tablet Commonly known as:  FOLVITE Take 1 tablet (1 mg total) by mouth daily.   magnesium hydroxide 400 MG/5ML suspension Commonly known as: MILK OF MAGNESIA Take 30 mLs by mouth daily as needed for mild constipation.   methocarbamol 500 MG tablet Commonly known as: ROBAXIN Take 1 tablet (500 mg total) by mouth every 8 (eight) hours as needed for muscle spasms.   multivitamin with minerals Tabs tablet Take 1 tablet by mouth daily.   ondansetron 4 MG tablet Commonly known as: ZOFRAN Take 1 tablet (4 mg total) by mouth every 6 (six) hours.   oxyCODONE 5 MG immediate release tablet Commonly known as: Roxicodone Take 1 tablet (5 mg total) by mouth every 6 (six) hours as needed.   ranitidine 75 MG tablet Commonly known as: ZANTAC Take 75 mg by mouth 2 (two) times daily as needed for heartburn.   thiamine 100 MG tablet Take 1 tablet (100 mg total) by mouth daily.         Follow-up Information    [facility], [place]. Go on 04/05/2021.   Specialty: General Surgery Why: at 1015am for follow up. Please arrive 30 minutes prior to your appointment for paperwork. Please bring a copy of your photo ID and insurance card.  Contact information: [address] [place] [address] [phone]        [facility] Follow up on 04/22/2021.   Specialty: Internal Medicine Why: Friday at 2:00 for your hospital follow up appointment. Please call to reschedule if this is not convenient for you Contact information: [facility] Follow up.   Why: Call to schedule a follow up appointment and to have ultrasound done. Otherwise you will need a referal to a gynecologist by your primary care provider Contact information: [address] [place], [place] [id number] [place] [address] [phone]              Signed: Alferd Apa, [facility]  Surgery 03/29/2021, 1:37 PM Please see Amion for pager number during day hours 7:00am-4:30pm

## 2021-04-22 ENCOUNTER — Ambulatory Visit: Payer: Self-pay | Admitting: Nurse Practitioner

## 2021-04-29 ENCOUNTER — Ambulatory Visit: Payer: Self-pay | Admitting: Nurse Practitioner

## 2021-06-06 ENCOUNTER — Other Ambulatory Visit: Payer: Self-pay

## 2021-06-06 ENCOUNTER — Ambulatory Visit (INDEPENDENT_AMBULATORY_CARE_PROVIDER_SITE_OTHER): Payer: Self-pay | Admitting: Nurse Practitioner

## 2021-06-06 ENCOUNTER — Encounter: Payer: Self-pay | Admitting: Nurse Practitioner

## 2021-06-06 VITALS — BP 147/91 | HR 83 | Temp 98.0°F | Ht 65.0 in | Wt 126.2 lb

## 2021-06-06 DIAGNOSIS — Z13220 Encounter for screening for lipoid disorders: Secondary | ICD-10-CM

## 2021-06-06 DIAGNOSIS — Z131 Encounter for screening for diabetes mellitus: Secondary | ICD-10-CM

## 2021-06-06 DIAGNOSIS — I10 Essential (primary) hypertension: Secondary | ICD-10-CM

## 2021-06-06 DIAGNOSIS — E876 Hypokalemia: Secondary | ICD-10-CM

## 2021-06-06 DIAGNOSIS — Z7689 Persons encountering health services in other specified circumstances: Secondary | ICD-10-CM

## 2021-06-06 DIAGNOSIS — K3580 Unspecified acute appendicitis: Secondary | ICD-10-CM

## 2021-06-06 LAB — POCT GLYCOSYLATED HEMOGLOBIN (HGB A1C): Hemoglobin A1C: 5 % (ref 4.0–5.6)

## 2021-06-06 MED ORDER — CARVEDILOL 3.125 MG PO TABS
3.1250 mg | ORAL_TABLET | Freq: Two times a day (BID) | ORAL | 3 refills | Status: DC
  Filled 2021-06-06: qty 60, 30d supply, fill #0
  Filled 2021-07-11: qty 60, 30d supply, fill #1
  Filled 2021-08-12: qty 60, 30d supply, fill #2
  Filled 2021-09-14: qty 60, 30d supply, fill #3
  Filled 2021-12-01: qty 60, 30d supply, fill #0
  Filled 2021-12-01: qty 60, 30d supply, fill #4
  Filled 2022-01-11: qty 60, 30d supply, fill #1
  Filled 2022-02-13: qty 60, 30d supply, fill #2
  Filled 2022-03-14: qty 60, 30d supply, fill #3

## 2021-06-06 MED ORDER — AMLODIPINE BESYLATE 10 MG PO TABS
10.0000 mg | ORAL_TABLET | Freq: Every day | ORAL | 3 refills | Status: DC
  Filled 2021-06-06: qty 30, 30d supply, fill #0
  Filled 2021-07-11: qty 30, 30d supply, fill #1
  Filled 2021-08-12: qty 30, 30d supply, fill #2
  Filled 2021-09-14: qty 30, 30d supply, fill #3
  Filled 2021-10-21: qty 30, 30d supply, fill #4
  Filled 2021-12-01: qty 30, 30d supply, fill #5
  Filled 2021-12-01: qty 30, 30d supply, fill #0
  Filled 2022-01-11: qty 30, 30d supply, fill #1
  Filled 2022-02-13: qty 30, 30d supply, fill #2
  Filled 2022-03-14: qty 30, 30d supply, fill #3
  Filled 2022-04-17 – 2022-04-27 (×2): qty 30, 30d supply, fill #4

## 2021-06-06 MED ORDER — ONDANSETRON 8 MG PO TBDP
8.0000 mg | ORAL_TABLET | Freq: Two times a day (BID) | ORAL | 0 refills | Status: AC
  Filled 2021-06-06: qty 30, 15d supply, fill #0

## 2021-06-06 NOTE — Patient Instructions (Signed)
Health Maintenance, Female Adopting a healthy lifestyle and getting preventive care are important in promoting health and wellness. Ask your health care provider about: The right schedule for you to have regular tests and exams. Things you can do on your own to prevent diseases and keep yourself healthy. What should I know about diet, weight, and exercise? Eat a healthy diet  Eat a diet that includes plenty of vegetables, fruits, low-fat dairy products, and lean protein. Do not eat a lot of foods that are high in solid fats, added sugars, or sodium.  Maintain a healthy weight Body mass index (BMI) is used to identify weight problems. It estimates body fat based on height and weight. Your health care provider can help determineyour BMI and help you achieve or maintain a healthy weight. Get regular exercise Get regular exercise. This is one of the most important things you can do for your health. Most adults should: Exercise for at least 150 minutes each week. The exercise should increase your heart rate and make you sweat (moderate-intensity exercise). Do strengthening exercises at least twice a week. This is in addition to the moderate-intensity exercise. Spend less time sitting. Even light physical activity can be beneficial. Watch cholesterol and blood lipids Have your blood tested for lipids and cholesterol at 55 years of age, then havethis test every 5 years. Have your cholesterol levels checked more often if: Your lipid or cholesterol levels are high. You are older than 55 years of age. You are at high risk for heart disease. What should I know about cancer screening? Depending on your health history and family history, you may need to have cancer screening at various ages. This may include screening for: Breast cancer. Cervical cancer. Colorectal cancer. Skin cancer. Lung cancer. What should I know about heart disease, diabetes, and high blood pressure? Blood pressure and heart  disease High blood pressure causes heart disease and increases the risk of stroke. This is more likely to develop in people who have high blood pressure readings, are of African descent, or are overweight. Have your blood pressure checked: Every 3-5 years if you are 18-39 years of age. Every year if you are 40 years old or older. Diabetes Have regular diabetes screenings. This checks your fasting blood sugar level. Have the screening done: Once every three years after age 40 if you are at a normal weight and have a low risk for diabetes. More often and at a younger age if you are overweight or have a high risk for diabetes. What should I know about preventing infection? Hepatitis B If you have a higher risk for hepatitis B, you should be screened for this virus. Talk with your health care provider to find out if you are at risk forhepatitis B infection. Hepatitis C Testing is recommended for: Everyone born from 1945 through 1965. Anyone with known risk factors for hepatitis C. Sexually transmitted infections (STIs) Get screened for STIs, including gonorrhea and chlamydia, if: You are sexually active and are younger than 55 years of age. You are older than 55 years of age and your health care provider tells you that you are at risk for this type of infection. Your sexual activity has changed since you were last screened, and you are at increased risk for chlamydia or gonorrhea. Ask your health care provider if you are at risk. Ask your health care provider about whether you are at high risk for HIV. Your health care provider may recommend a prescription medicine to help   prevent HIV infection. If you choose to take medicine to prevent HIV, you should first get tested for HIV. You should then be tested every 3 months for as long as you are taking the medicine. Pregnancy If you are about to stop having your period (premenopausal) and you may become pregnant, seek counseling before you get  pregnant. Take 400 to 800 micrograms (mcg) of folic acid every day if you become pregnant. Ask for birth control (contraception) if you want to prevent pregnancy. Osteoporosis and menopause Osteoporosis is a disease in which the bones lose minerals and strength with aging. This can result in bone fractures. If you are 65 years old or older, or if you are at risk for osteoporosis and fractures, ask your health care provider if you should: Be screened for bone loss. Take a calcium or vitamin D supplement to lower your risk of fractures. Be given hormone replacement therapy (HRT) to treat symptoms of menopause. Follow these instructions at home: Lifestyle Do not use any products that contain nicotine or tobacco, such as cigarettes, e-cigarettes, and chewing tobacco. If you need help quitting, ask your health care provider. Do not use street drugs. Do not share needles. Ask your health care provider for help if you need support or information about quitting drugs. Alcohol use Do not drink alcohol if: Your health care provider tells you not to drink. You are pregnant, may be pregnant, or are planning to become pregnant. If you drink alcohol: Limit how much you use to 0-1 drink a day. Limit intake if you are breastfeeding. Be aware of how much alcohol is in your drink. In the [place] one drink equals one 12 oz bottle of beer (355 mL), one 5 oz glass of wine (148 mL), or one 1 oz glass of hard liquor (44 mL). General instructions Schedule regular health, dental, and eye exams. Stay current with your vaccines. Tell your health care provider if: You often feel depressed. You have ever been abused or do not feel safe at home. Summary Adopting a healthy lifestyle and getting preventive care are important in promoting health and wellness. Follow your health care provider's instructions about healthy diet, exercising, and getting tested or screened for diseases. Follow your health care provider's  instructions on monitoring your cholesterol and blood pressure. This information is not intended to replace advice given to you by your health care provider. Make sure you discuss any questions you have with your healthcare provider. Document Revised: 10/30/2018 Document Reviewed: 10/30/2018 Elsevier Patient Education  2022 [employer].  

## 2021-06-06 NOTE — Progress Notes (Signed)
Integrated Behavioral Health Case Management Referral Note  06/06/2021 Name: Tracy Fischer MRN: [id number] DOB: 1966/11/06 Tracy Fischer is a 55 y.o. year old female who sees Vevelyn Francois, NP for primary care. LCSW was consulted to assess patient's needs and assist the patient with  financial difficulties related to lack of health insurance .  Interpreter: No.   Interpreter Name & Language: none  Assessment: Patient experiencing  financial difficulties related to lack of health insurance .   Intervention: Provided patient with [employer] and [employer]. Reviewed application and advised patient on supporting documents to be submitted with the application. Advised patient to follow up with CSW for assistance in scheduling appointment with financial counselor at [facility] and [facility]) to submit the application if needed. Provided [place] and CSW contact information.   Review of patient status, including review of consultants reports, relevant laboratory and other test results, and collaboration with appropriate care team members and the patient's provider was performed as part of comprehensive patient evaluation and provision of services.    Estanislado Emms, [facility] [phone]

## 2021-06-06 NOTE — Progress Notes (Signed)
[place], [place]  [address] Phone:  [phone]   Fax:  [phone]   New Patient Office Visit  Subjective:  Patient ID: Tracy Fischer, female    DOB: 1966/05/19  Age: 55 y.o. MRN: [id number]  CC:  Chief Complaint  Patient presents with   [facility] presents establish care. She  has a past medical history of Hypertension.   She was diagnosed with a mass in her pelvic this was accidentally  found with emergency surgery for appendectomy . She reports that she has nausea no vomiting her weight is down six pound. She reports that this is due to decreased appetite. She has heart burn. She uses Zantac which is effective. She has normal bowel movement. She has some pelvic pain with bowel movement. She is unsure if she has a history of fibroids. Her daughter is 68. Her LMP was 5 years ago. She denies any abnormal bleeding.     Past Medical History:  Diagnosis Date   Hypertension     Past Surgical History:  Procedure Laterality Date   CESAREAN SECTION     CESAREAN SECTION     LAPAROSCOPIC APPENDECTOMY N/A 03/17/2021   Procedure: APPENDECTOMY LAPAROSCOPIC;  Surgeon: Stechschulte, Nickola Major, MD;  Location: [facility];  Service: General;  Laterality: N/A;    Family History  Problem Relation Age of Onset   Diabetes Mellitus II Father    Hypertension Father     Social History   Socioeconomic History   Marital status: Divorced    Spouse name: Not on file   Number of children: Not on file   Years of education: Not on file   Highest education level: Not on file  Occupational History   Not on file  Tobacco Use   Smoking status: Every Day    Packs/day: 0.30    Years: 30.00    Pack years: 9.00    Types: Cigarettes   Smokeless tobacco: Never  Substance and Sexual Activity   Alcohol use: Yes    Comment: weekly wine or liquor   Drug use: Yes    Types: Cocaine, Marijuana    Comment: helps with nausea  (increased over the last few months   Sexual activity: Yes    Partners: Male    Birth control/protection: Post-menopausal  Other Topics Concern   Not on file  Social History Narrative   Not on file   Social Determinants of Health   Financial Resource Strain: Not on file  Food Insecurity: Not on file  Transportation Needs: Not on file  Physical Activity: Not on file  Stress: Not on file  Social Connections: Not on file  Intimate Partner Violence: Not on file    ROS Review of Systems  Objective:   Today's Vitals: BP (!) 147/91 (BP Location: Right Arm, Patient Position: Sitting, Cuff Size: Normal)   Pulse 83   Temp 98 F (36.7 C)   Ht 5\' 5"  (1.651 m)   Wt 126 lb 3.2 oz (57.2 kg)   SpO2 98%   BMI 21.00 kg/m   Physical Exam Constitutional:      Appearance: She is normal weight.  HENT:     Head: Normocephalic and atraumatic.     Nose: Nose normal.     Mouth/Throat:     Mouth: Mucous membranes are moist.  Cardiovascular:     Rate and Rhythm: Normal rate and regular rhythm.  Pulses: Normal pulses.     Heart sounds: Normal heart sounds.  Pulmonary:     Effort: Pulmonary effort is normal.     Breath sounds: Normal breath sounds.  Abdominal:     General: Bowel sounds are normal.     Palpations: Abdomen is soft.  Musculoskeletal:        General: Normal range of motion.     Cervical back: Normal range of motion.  Skin:    General: Skin is warm and dry.     Capillary Refill: Capillary refill takes less than 2 seconds.  Neurological:     General: No focal deficit present.     Mental Status: She is alert and oriented to person, place, and time.  Psychiatric:        Mood and Affect: Mood normal.        Behavior: Behavior normal.        Thought Content: Thought content normal.        Judgment: Judgment normal.    Assessment & Plan:   Problem List Items Addressed This Visit       Digestive   Acute appendicitis   Other Visit Diagnoses     Encounter to  establish care    -  Primary .Discussed female health maintenance; SBE, annual CBE, PAP test Discussed general safety in vehicle and COVID Discussed regular hydration with water Discussed healthy diet and exercise and weight management Discussed sexual health  Discussed mental health Encouraged to call our office for an appointment with in ongoing concerns for questions.     Hypokalemia       Relevant Orders   Comp. Metabolic Panel (12) (Completed)   Hypertension, unspecified type     Persistent We will continue with current regimen Encouraged on going compliance  Encouraged home monitoring and recording BP <130/80 Eating a heart-healthy diet with less salt Encouraged regular physical activity     Relevant Medications   carvedilol (COREG) 3.125 MG tablet   amLODipine (NORVASC) 10 MG tablet   Screening for cholesterol level       Relevant Orders   Lipid panel (Completed)   Screening for diabetes mellitus (DM)       Relevant Orders   POCT glycosylated hemoglobin (Hb A1C) (Completed)   POCT urinalysis dipstick       Outpatient Encounter Medications as of 06/06/2021  Medication Sig   ondansetron (ZOFRAN-ODT) 8 MG disintegrating tablet Take 1 tablet (8 mg total) by mouth 2 (two) times daily for 15 days.   acetaminophen (TYLENOL) 500 MG tablet Take 2 tablets (1,000 mg total) by mouth every 8 (eight) hours as needed for mild pain.   amLODipine (NORVASC) 10 MG tablet Take 1 tablet (10 mg total) by mouth daily.   aspirin 325 MG tablet Take 325 mg by mouth every 4 (four) hours as needed for moderate pain.   bismuth subsalicylate (PEPTO BISMOL) 262 MG/15ML suspension Take 30 mLs by mouth every 6 (six) hours as needed. For upset stomach   carvedilol (COREG) 3.125 MG tablet Take 1 tablet (3.125 mg total) by mouth 2 (two) times daily with a meal.   docusate sodium (COLACE) 100 MG capsule Take 1 capsule (100 mg total) by mouth 2 (two) times daily as needed for mild constipation.   folic  acid (FOLVITE) 1 MG tablet Take 1 tablet (1 mg total) by mouth daily.   magnesium hydroxide (MILK OF MAGNESIA) 400 MG/5ML suspension Take 30 mLs by mouth daily as needed for mild constipation.  Multiple Vitamin (MULTIVITAMIN WITH MINERALS) TABS tablet Take 1 tablet by mouth daily.   ranitidine (ZANTAC) 75 MG tablet Take 75 mg by mouth 2 (two) times daily as needed for heartburn.   thiamine 100 MG tablet Take 1 tablet (100 mg total) by mouth daily.   [DISCONTINUED] amLODipine (NORVASC) 10 MG tablet Take 1 tablet (10 mg total) by mouth daily.   [DISCONTINUED] amoxicillin-clavulanate (AUGMENTIN) 875-125 MG tablet Take 1 tablet by mouth every 12 (twelve) hours.   [DISCONTINUED] carvedilol (COREG) 3.125 MG tablet Take 1 tablet (3.125 mg total) by mouth 2 (two) times daily with a meal.   [DISCONTINUED] methocarbamol (ROBAXIN) 500 MG tablet Take 1 tablet (500 mg total) by mouth every 8 (eight) hours as needed for muscle spasms.   [DISCONTINUED] ondansetron (ZOFRAN) 4 MG tablet Take 1 tablet (4 mg total) by mouth every 6 (six) hours.   [DISCONTINUED] oxyCODONE (ROXICODONE) 5 MG immediate release tablet Take 1 tablet (5 mg total) by mouth every 6 (six) hours as needed.   No facility-administered encounter medications on file as of 06/06/2021.    Follow-up: Return in about 3 months (around 09/06/2021).   Vevelyn Francois, NP

## 2021-06-07 ENCOUNTER — Other Ambulatory Visit: Payer: Self-pay

## 2021-06-07 LAB — COMP. METABOLIC PANEL (12)
AST: 16 IU/L (ref 0–40)
Albumin/Globulin Ratio: 1.5 (ref 1.2–2.2)
Albumin: 4.2 g/dL (ref 3.8–4.9)
Alkaline Phosphatase: 116 IU/L (ref 44–121)
BUN/Creatinine Ratio: 11 (ref 9–23)
BUN: 8 mg/dL (ref 6–24)
Bilirubin Total: 0.4 mg/dL (ref 0.0–1.2)
Calcium: 9.9 mg/dL (ref 8.7–10.2)
Chloride: 100 mmol/L (ref 96–106)
Creatinine, Ser: 0.71 mg/dL (ref 0.57–1.00)
Globulin, Total: 2.8 g/dL (ref 1.5–4.5)
Glucose: 90 mg/dL (ref 65–99)
Potassium: 4.3 mmol/L (ref 3.5–5.2)
Sodium: 138 mmol/L (ref 134–144)
Total Protein: 7 g/dL (ref 6.0–8.5)
eGFR: 100 mL/min/{1.73_m2} (ref >59)

## 2021-06-07 LAB — LIPID PANEL
Chol/HDL Ratio: 2.5 ratio (ref 0.0–4.4)
Cholesterol, Total: 158 mg/dL (ref 100–199)
HDL: 63 mg/dL (ref >39)
LDL Chol Calc (NIH): 78 mg/dL (ref 0–99)
Triglycerides: 89 mg/dL (ref 0–149)
VLDL Cholesterol Cal: 17 mg/dL (ref 5–40)

## 2021-06-09 LAB — POCT URINALYSIS DIPSTICK
Bilirubin, UA: NEGATIVE
Glucose, UA: NEGATIVE
Ketones, UA: NEGATIVE
Leukocytes, UA: NEGATIVE
Nitrite, UA: NEGATIVE
Protein, UA: NEGATIVE
Spec Grav, UA: >=1.03 — AB (ref 1.010–1.025)
Urobilinogen, UA: 1 E.U./dL
pH, UA: 6 (ref 5.0–8.0)

## 2021-06-24 ENCOUNTER — Ambulatory Visit: Payer: Self-pay

## 2021-07-11 ENCOUNTER — Other Ambulatory Visit: Payer: Self-pay

## 2021-07-13 ENCOUNTER — Other Ambulatory Visit: Payer: Self-pay

## 2021-08-12 ENCOUNTER — Other Ambulatory Visit: Payer: Self-pay

## 2021-09-05 ENCOUNTER — Other Ambulatory Visit: Payer: Self-pay

## 2021-09-05 ENCOUNTER — Ambulatory Visit (INDEPENDENT_AMBULATORY_CARE_PROVIDER_SITE_OTHER): Payer: Self-pay | Admitting: Nurse Practitioner

## 2021-09-05 ENCOUNTER — Encounter: Payer: Self-pay | Admitting: Nurse Practitioner

## 2021-09-05 VITALS — BP 126/87 | HR 68 | Temp 97.4°F | Ht 65.0 in | Wt 124.2 lb

## 2021-09-05 DIAGNOSIS — J309 Allergic rhinitis, unspecified: Secondary | ICD-10-CM | POA: Insufficient documentation

## 2021-09-05 DIAGNOSIS — I10 Essential (primary) hypertension: Secondary | ICD-10-CM | POA: Insufficient documentation

## 2021-09-05 DIAGNOSIS — R1900 Intra-abdominal and pelvic swelling, mass and lump, unspecified site: Secondary | ICD-10-CM

## 2021-09-05 NOTE — Progress Notes (Signed)
[place], [place]  [address] Phone:  [phone]   Fax:  [phone]   Established Patient Office Visit  Subjective:  Patient ID: Tracy Fischer, female    DOB: Apr 01, 1966  Age: 55 y.o. MRN: [id number]  CC:  Chief Complaint  Patient presents with  . Follow-up    Pt states that she is here for a follow up visit.  Pt would like a referral to an Ob/Gyn for mass on her pelvic.     HPI Tracy Fischer presents for follow up. She  has a past medical history of Hypertension.   She has a left pelvic mass that was seen on CT when she had to have surgery for her appendecstomy. She did not have insurance so the evaluation was placed on hold. She would like to have this evaluated. She has occasional pain. She denies any abdominal bleeding. Her last cycle was 4-5 years go.   Past Medical History:  Diagnosis Date  . Hypertension     Past Surgical History:  Procedure Laterality Date  . CESAREAN SECTION    . CESAREAN SECTION    . LAPAROSCOPIC APPENDECTOMY N/A 03/17/2021   Procedure: APPENDECTOMY LAPAROSCOPIC;  Surgeon: Felicie Morn, MD;  Location: [facility];  Service: General;  Laterality: N/A;    Family History  Problem Relation Age of Onset  . Diabetes Mellitus II Father   . Hypertension Father     Social History   Socioeconomic History  . Marital status: Divorced    Spouse name: Not on file  . Number of children: Not on file  . Years of education: Not on file  . Highest education level: Not on file  Occupational History  . Not on file  Tobacco Use  . Smoking status: Every Day    Packs/day: 1.00    Years: 30.00    Pack years: 30.00    Types: Cigarettes  . Smokeless tobacco: Never  . Tobacco comments:    1 Pack every 2 to 3 days  Vaping Use  . Vaping Use: Never used  Substance and Sexual Activity  . Alcohol use: Yes    Comment: weekly wine or liquor  . Drug use: Yes    Types: Marijuana    Comment: helps  with nausea (increased over the last few months  . Sexual activity: Yes    Partners: Male    Birth control/protection: Post-menopausal  Other Topics Concern  . Not on file  Social History Narrative  . Not on file   Social Determinants of Health   Financial Resource Strain: Not on file  Food Insecurity: Not on file  Transportation Needs: Not on file  Physical Activity: Not on file  Stress: Not on file  Social Connections: Not on file  Intimate Partner Violence: Not on file    Outpatient Medications Prior to Visit  Medication Sig Dispense Refill  . acetaminophen (TYLENOL) 500 MG tablet Take 2 tablets (1,000 mg total) by mouth every 8 (eight) hours as needed for mild pain. 30 tablet 0  . amLODipine (NORVASC) 10 MG tablet Take 1 tablet (10 mg total) by mouth daily. 90 tablet 3  . carvedilol (COREG) 3.125 MG tablet Take 1 tablet (3.125 mg total) by mouth 2 (two) times daily with a meal. 180 tablet 3  . Multiple Vitamin (MULTIVITAMIN WITH MINERALS) TABS tablet Take 1 tablet by mouth daily. 30 tablet 0  . ranitidine (ZANTAC) 75 MG tablet Take 75  mg by mouth 2 (two) times daily as needed for heartburn.    Marland Kitchen aspirin 325 MG tablet Take 325 mg by mouth every 4 (four) hours as needed for moderate pain. (Patient not taking: Reported on 09/05/2021)    . bismuth subsalicylate (PEPTO BISMOL) 262 MG/15ML suspension Take 30 mLs by mouth every 6 (six) hours as needed. For upset stomach (Patient not taking: Reported on 09/05/2021)    . docusate sodium (COLACE) 100 MG capsule Take 1 capsule (100 mg total) by mouth 2 (two) times daily as needed for mild constipation. (Patient not taking: Reported on 09/05/2021) 10 capsule 0  . folic acid (FOLVITE) 1 MG tablet Take 1 tablet (1 mg total) by mouth daily. (Patient not taking: Reported on 09/05/2021)    . magnesium hydroxide (MILK OF MAGNESIA) 400 MG/5ML suspension Take 30 mLs by mouth daily as needed for mild constipation. (Patient not taking: Reported on  09/05/2021)    . thiamine 100 MG tablet Take 1 tablet (100 mg total) by mouth daily. (Patient not taking: Reported on 09/05/2021)     No facility-administered medications prior to visit.    No Known Allergies  ROS Review of Systems    Objective:    Physical Exam Constitutional:      Appearance: She is normal weight.  HENT:     Head: Normocephalic and atraumatic.  Cardiovascular:     Rate and Rhythm: Normal rate and regular rhythm.     Pulses: Normal pulses.     Heart sounds: Normal heart sounds.  Pulmonary:     Effort: Pulmonary effort is normal.     Breath sounds: Normal breath sounds.  Abdominal:     General: Bowel sounds are normal.     Palpations: Abdomen is soft.  Musculoskeletal:        General: Normal range of motion.     Cervical back: Normal range of motion.  Skin:    General: Skin is warm and dry.     Capillary Refill: Capillary refill takes less than 2 seconds.  Neurological:     General: No focal deficit present.     Mental Status: She is alert and oriented to person, place, and time.  Psychiatric:        Mood and Affect: Mood normal.        Behavior: Behavior normal.        Thought Content: Thought content normal.        Judgment: Judgment normal.    BP 126/87   Pulse 68   Temp (!) 97.4 F (36.3 C)   Ht 5' 5"  (1.651 m)   Wt 124 lb 3.2 oz (56.3 kg)   SpO2 100%   BMI 20.67 kg/m  Wt Readings from Last 3 Encounters:  09/05/21 124 lb 3.2 oz (56.3 kg)  06/06/21 126 lb 3.2 oz (57.2 kg)  03/17/21 130 lb 1.1 oz (59 kg)     There are no preventive care reminders to display for this patient.   There are no preventive care reminders to display for this patient.  No results found for: TSH Lab Results  Component Value Date   WBC 10.8 (H) 03/24/2021   HGB 11.0 (L) 03/24/2021   HCT 33.2 (L) 03/24/2021   MCV 95.7 03/24/2021   PLT 430 (H) 03/24/2021   Lab Results  Component Value Date   NA 138 06/06/2021   K 4.3 06/06/2021   CO2 24 03/24/2021    GLUCOSE 90 06/06/2021   BUN 8 06/06/2021  CREATININE 0.71 06/06/2021   BILITOT 0.4 06/06/2021   ALKPHOS 116 06/06/2021   AST 16 06/06/2021   ALT 22 03/16/2021   PROT 7.0 06/06/2021   ALBUMIN 4.2 06/06/2021   CALCIUM 9.9 06/06/2021   ANIONGAP 13 03/24/2021   EGFR 100 06/06/2021   Lab Results  Component Value Date   CHOL 158 06/06/2021   Lab Results  Component Value Date   HDL 63 06/06/2021   Lab Results  Component Value Date   LDLCALC 78 06/06/2021   Lab Results  Component Value Date   TRIG 89 06/06/2021   Lab Results  Component Value Date   CHOLHDL 2.5 06/06/2021   Lab Results  Component Value Date   HGBA1C 5.0 06/06/2021      Assessment & Plan:   Problem List Items Addressed This Visit       Cardiovascular and Mediastinum   Hypertension, unspecified type Stable Encouraged on going compliance with current medication regimen Encouraged home monitoring and recording BP <130/80 Eating a heart-healthy diet with less salt Encouraged regular physical activity     Other Visit Diagnoses     Pelvic mass in female    -  Primary   Relevant Orders   Ambulatory referral to Gynecology       No orders of the defined types were placed in this encounter.   Follow-up: Return in about 3 months (around 12/06/2021) for follow up [address].    Vevelyn Francois, NP

## 2021-09-05 NOTE — Patient Instructions (Signed)
Pelvic Mass, Female A pelvic mass is an abnormal growth in the pelvis. The pelvis is the area between your hip bones. It includes the bladder, rectum, uterus, and ovaries. A pelvic mass may be found during a routine pelvic exam or while performing an MRI, CT scan, or ultrasound for other problems of the abdomen. What are common types of pelvic masses? Pelvic masses include: Ovarian cysts. These are fluid-filled sacs that form on an ovary. Tumors. These may be cancerous (malignant) or noncancerous (benign). Noncancerous tumors in the uterus are called uterine fibroids. Ectopic pregnancy. This is when the fertilized egg attaches (implants) outside the uterus. Infections. What type of testing may be needed? Your health care provider may recommend that you have tests to diagnose the cause of the pelvic mass. The following tests may be done if a pelvic mass is found: Physical exam. Blood tests. X-rays. Ultrasound. CT scan. MRI. A surgery to look inside your abdomen with cameras (laparoscopy). A biopsy that is performed with a needle or during laparoscopy or surgery. In some cases, what seemed like a pelvic mass may actually be something else, such as a mass in one of the organs that is near the pelvis, an infection (abscess), or scar tissue (adhesions) that formed after a surgery. Tests and physical exams may be done once, or they may be done regularly for a period of time. Tests and exams that are done regularly will help monitor whether the mass or tissue change is growing and becoming a concern. What are common treatments? Treatment is not always needed for this condition. Your health care provider may recommend careful monitoring (watchful waiting) and regular tests and exams. Treatment will depend on the cause of the mass. Follow these instructions at home: What you need to do at home will depend on the cause of the mass. Follow the instructions that your health care provider gives to you. In  general: Keep all follow-up visits as directed by your health care provider. This is important. Take over-the-counter and prescription medicines only as directed by your health care provider. If you were prescribed an antibiotic medicine, take it as told by your health care provider. Do not stop taking the antibiotic even if you start to feel better. Follow any restrictions that are given to you by your health care provider. Try to stay calm, and be sure to ask questions. Make sure you understand the recommendations for monitoring and whether there is a reason for concern. Contact a health care provider if you: Develop new symptoms. Note changes in the size, shape, or position of your mass. Are unable to have a bowel movement. Bruise or bleed easily. Get help right away if you: Vomit bright red blood or vomit material that looks like coffee grounds. Have blood in your stools, or the stools turn black and tarry. Have an abnormal or increased amount of vaginal bleeding. Have a fever or chills. Develop sudden or worsening pain that is not relieved by medicine. Feel dizzy or weak. Feel light-headed or you faint. Feel that the mass has suddenly gotten larger. Develop severe bloating in your abdomen or your pelvis. Cannot pass any urine. Summary A pelvic mass is an abnormal growth in the pelvis. The pelvis is the area between your hip bones. It includes the bladder, rectum, uterus, and ovaries. Pelvic masses include ovarian cysts, tumors, ectopic pregnancy, or infections. Your health care provider may recommend that you have tests to diagnose the cause of the pelvic mass. Treatment will depend  on the cause of the mass. This information is not intended to replace advice given to you by your health care provider. Make sure you discuss any questions you have with your health care provider. Document Revised: 11/28/2017 Document Reviewed: 11/28/2017 Elsevier Patient Education  2022 [employer].

## 2021-09-09 ENCOUNTER — Encounter: Payer: Self-pay | Admitting: Nurse Practitioner

## 2021-09-14 ENCOUNTER — Other Ambulatory Visit: Payer: Self-pay

## 2021-09-16 ENCOUNTER — Other Ambulatory Visit: Payer: Self-pay

## 2021-10-21 ENCOUNTER — Other Ambulatory Visit: Payer: Self-pay

## 2021-11-23 ENCOUNTER — Other Ambulatory Visit: Payer: Self-pay

## 2021-11-23 DIAGNOSIS — R1900 Intra-abdominal and pelvic swelling, mass and lump, unspecified site: Secondary | ICD-10-CM

## 2021-12-01 ENCOUNTER — Other Ambulatory Visit: Payer: Self-pay

## 2021-12-08 ENCOUNTER — Other Ambulatory Visit: Payer: Self-pay

## 2021-12-08 ENCOUNTER — Ambulatory Visit (INDEPENDENT_AMBULATORY_CARE_PROVIDER_SITE_OTHER): Payer: Self-pay | Admitting: Nurse Practitioner

## 2021-12-08 ENCOUNTER — Encounter: Payer: Self-pay | Admitting: Nurse Practitioner

## 2021-12-08 VITALS — BP 123/83 | HR 69 | Temp 97.5°F | Ht 65.0 in | Wt 126.0 lb

## 2021-12-08 DIAGNOSIS — I10 Essential (primary) hypertension: Secondary | ICD-10-CM

## 2021-12-08 DIAGNOSIS — Z130 Encounter for screening for diseases of the blood and blood-forming organs and certain disorders involving the immune mechanism: Secondary | ICD-10-CM

## 2021-12-08 DIAGNOSIS — F339 Major depressive disorder, recurrent, unspecified: Secondary | ICD-10-CM

## 2021-12-08 MED ORDER — MIRTAZAPINE 15 MG PO TABS: 15.0000 mg | ORAL_TABLET | Freq: Every day | ORAL | 5 refills | Status: DC

## 2021-12-08 NOTE — Patient Instructions (Signed)
Major Depressive Disorder, Adult Major depressive disorder is a mental health condition. This disorder affects feelings. It can also affect the body. Symptoms of this condition last most of the day, almost every day, for 2 weeks. This disorder can affect: Relationships. Daily activities, such as work and school. Activities that you normally like to do. What are the causes? The cause of this condition is not known. The disorder is likely caused by a mix of things, including: Your personality, such as being a shy person. Your behavior, or how you act toward others. Your thoughts and feelings. Too much alcohol or drugs. How you react to stress. Health and mental problems that you have had for a long time. Things that hurt you in the past (trauma). Big changes in your life, such as divorce. What increases the risk? The following factors may make you more likely to develop this condition: Having family members with depression. Being a woman. Problems in the family. Low levels of some brain chemicals. Things that caused you pain as a child, especially if you lost a parent or were abused. A lot of stress in your life, such as from: Living without basic needs of life, such as food and shelter. Being treated poorly because of race, sex, or religion (discrimination). Health and mental problems that you have had for a long time. What are the signs or symptoms? The main symptoms of this condition are: Being sad all the time. Being grouchy all the time. Loss of interest in things and activities. Other symptoms include: Sleeping too much or too little. Eating too much or too little. Gaining or losing weight, without knowing why. Feeling tired or having low energy. Being restless and weak. Feeling hopeless, worthless, or guilty. Trouble thinking clearly or making decisions. Thoughts of hurting yourself or others, or thoughts of ending your life. Spending a lot of time alone. Inability to  complete common tasks of daily life. If you have very bad MDD, you may: Believe things that are not true. Hear, see, taste, or feel things that are not there. Have mild depression that lasts for at least 2 years. Feel very sad and hopeless. Have trouble speaking or moving. How is this treated? This condition may be treated with: Talk therapy. This teaches you to know bad thoughts, feelings, and actions and how to change them. This can also help you to communicate with others. This can be done with members of your family. Medicines. These can be used to treat worry (anxiety), depression, or low levels of chemicals in the brain. Lifestyle changes. You may need to: Limit alcohol use. Limit drug use. Get regular exercise. Get plenty of sleep. Make healthy eating choices. Spend more time outdoors. Brain stimulation. This treatment excites the brain. This is done when symptoms are very bad or have not gotten better with other treatments. Follow these instructions at home: Activity Get regular exercise as told. Spend time outdoors as told. Make time to do the things you enjoy. Find ways to deal with stress. Try to: Meditate. Do deep breathing. Spend time in nature. Keep a journal. Return to your normal activities as told by your doctor. Ask your doctor what activities are safe for you. Alcohol and drug use If you drink alcohol: Limit how much you use to: 0-1 drink a day for women. 0-2 drinks a day for men. Be aware of how much alcohol is in your drink. In the [place] one drink equals one 12 oz bottle of beer (355 mL),  one 5 oz glass of wine (148 mL), or one 1 oz glass of hard liquor (44 mL). Talk to your doctor about: Alcohol use. Alcohol can affect some medicines. Any drug use. General instructions  Take over-the-counter and prescription medicines and herbal preparations only as told by your doctor. Eat a healthy diet. Get a lot of sleep. Think about joining a support group.  Your doctor may be able to suggest one. Keep all follow-up visits as told by your doctor. This is important. Where to find more information: [employer] on Mental Illness: www.nami.[place]: https://carter.com/ American Psychiatric Association: www.psychiatry.org/patients-families/ Contact a doctor if: Your symptoms get worse. You get new symptoms. Get help right away if: You hurt yourself. You have serious thoughts about hurting yourself or others. You see, hear, taste, smell, or feel things that are not there. If you ever feel like you may hurt yourself or others, or have thoughts about taking your own life, get help right away. Go to your nearest emergency department or: Call your local emergency services (911 in the [place] Call a suicide crisis helpline, such as the [facility] at (681)064-6261 or 988 in the [place]. This is open 24 hours a day in the [place] Text the Crisis Text Line at 786-340-2187 (in the [place].). Summary Major depressive disorder is a mental health condition. This disorder affects feelings. Symptoms of this condition last most of the day, almost every day, for 2 weeks. The symptoms of this disorder can cause problems with relationships and with daily activities. There are treatments and support for people who get this disorder. You may need more than one type of treatment. Get help right away if you have serious thoughts about hurting yourself or others. This information is not intended to replace advice given to you by your health care provider. Make sure you discuss any questions you have with your health care provider. Document Revised: 06/01/2021 Document Reviewed: 10/18/2019 Elsevier Patient Education  2022 [employer].

## 2021-12-08 NOTE — Progress Notes (Signed)
[facility] [place], [place]  [address] Phone:  [phone]   Fax:  [phone]   Established Patient Office Visit  Subjective:  Patient ID: Tracy Fischer, female    DOB: 01/17/1966  Age: 56 y.o. MRN: [id number]  CC:  Chief Complaint  Patient presents with   Follow-up    Pt is here today for her follow up visit. Pt states that she has been depressed associated with some anxiety over that past several days.      HPI Tracy Fischer presents for follow up. She  has a past medical history of Hypertension.   Sh is in today of a follow up on HTN. She is on amlodipine 10 mg daily and carvedilol 3.125 mg BID . She is compliant with her regimen. She does not exercise due to her depression and the desire to stay in bed most days. Her weight has been stable per our scales however she reports her weight is fluctuating. She was down in the 110's. She reports that her depression is worse with the last few months. This is related to domestic stressors. She has a heaviness. She has a history of depression and insomnia. She has been on several medication. She was taking Wellbutrin.  She does not feel like this was effective. She is not sure of other medications that she has taken. This is not reflected in her EMR She is open to treatment with medication and counseling.     Past Medical History:  Diagnosis Date   Hypertension     Past Surgical History:  Procedure Laterality Date   CESAREAN SECTION     CESAREAN SECTION     LAPAROSCOPIC APPENDECTOMY N/A 03/17/2021   Procedure: APPENDECTOMY LAPAROSCOPIC;  Surgeon: Stechschulte, Nickola Major, MD;  Location: [facility];  Service: General;  Laterality: N/A;    Family History  Problem Relation Age of Onset   Diabetes Mellitus II Father    Hypertension Father     Social History   Socioeconomic History   Marital status: Divorced    Spouse name: Not on file   Number of children: Not on file   Years of  education: Not on file   Highest education level: Not on file  Occupational History   Not on file  Tobacco Use   Smoking status: Every Day    Packs/day: 1.00    Years: 30.00    Pack years: 30.00    Types: Cigarettes   Smokeless tobacco: Never   Tobacco comments:    1 Pack every 2 to 3 days  Vaping Use   Vaping Use: Never used  Substance and Sexual Activity   Alcohol use: Yes    Comment: weekly wine or liquor   Drug use: Yes    Types: Marijuana    Comment: helps with nausea (increased over the last few months   Sexual activity: Yes    Partners: Male    Birth control/protection: Post-menopausal  Other Topics Concern   Not on file  Social History Narrative   Not on file   Social Determinants of Health   Financial Resource Strain: Not on file  Food Insecurity: Not on file  Transportation Needs: Not on file  Physical Activity: Not on file  Stress: Not on file  Social Connections: Not on file  Intimate Partner Violence: Not on file    Outpatient Medications Prior to Visit  Medication Sig Dispense Refill   acetaminophen (TYLENOL) 500 MG tablet  Take 2 tablets (1,000 mg total) by mouth every 8 (eight) hours as needed for mild pain. 30 tablet 0   amLODipine (NORVASC) 10 MG tablet Take 1 tablet (10 mg total) by mouth daily. 90 tablet 3   carvedilol (COREG) 3.125 MG tablet Take 1 tablet (3.125 mg total) by mouth 2 (two) times daily with a meal. 180 tablet 3   ranitidine (ZANTAC) 75 MG tablet Take 75 mg by mouth 2 (two) times daily as needed for heartburn.     aspirin 325 MG tablet Take 325 mg by mouth every 4 (four) hours as needed for moderate pain. (Patient not taking: Reported on 09/05/2021)     bismuth subsalicylate (PEPTO BISMOL) 262 MG/15ML suspension Take 30 mLs by mouth every 6 (six) hours as needed. For upset stomach (Patient not taking: Reported on 09/05/2021)     docusate sodium (COLACE) 100 MG capsule Take 1 capsule (100 mg total) by mouth 2 (two) times daily as needed  for mild constipation. (Patient not taking: Reported on 09/05/2021) 10 capsule 0   folic acid (FOLVITE) 1 MG tablet Take 1 tablet (1 mg total) by mouth daily. (Patient not taking: Reported on 09/05/2021)     magnesium hydroxide (MILK OF MAGNESIA) 400 MG/5ML suspension Take 30 mLs by mouth daily as needed for mild constipation. (Patient not taking: Reported on 09/05/2021)     Multiple Vitamin (MULTIVITAMIN WITH MINERALS) TABS tablet Take 1 tablet by mouth daily. (Patient not taking: Reported on 12/08/2021) 30 tablet 0   thiamine 100 MG tablet Take 1 tablet (100 mg total) by mouth daily. (Patient not taking: Reported on 09/05/2021)     No facility-administered medications prior to visit.    No Known Allergies  ROS Review of Systems    Objective:    Physical Exam Constitutional:      Appearance: She is normal weight.  HENT:     Head: Normocephalic and atraumatic.  Cardiovascular:     Rate and Rhythm: Normal rate and regular rhythm.     Pulses: Normal pulses.     Heart sounds: Normal heart sounds.  Pulmonary:     Effort: Pulmonary effort is normal.     Breath sounds: Normal breath sounds.  Musculoskeletal:        General: Normal range of motion.     Cervical back: Normal range of motion.  Skin:    General: Skin is warm.     Capillary Refill: Capillary refill takes less than 2 seconds.  Neurological:     General: No focal deficit present.     Mental Status: She is alert and oriented to person, place, and time.    BP 123/83    Pulse 69    Temp (!) 97.5 F (36.4 C)    Ht _0  (1.651 m)    Wt 126 lb (57.2 kg)    SpO2 98%    BMI 20.97 kg/m  Wt Readings from Last 3 Encounters:  12/08/21 126 lb (57.2 kg)  09/05/21 124 lb 3.2 oz (56.3 kg)  06/06/21 126 lb 3.2 oz (57.2 kg)     Health Maintenance Due  Topic Date Due   PAP SMEAR-Modifier  Never done   COVID-19 Vaccine (4 - Booster for Pfizer series) 12/21/2020    There are no preventive care reminders to display for this  patient.  No results found for: TSH Lab Results  Component Value Date   WBC 10.8 (H) 03/24/2021   HGB 11.0 (L) 03/24/2021   HCT 33.2 (  L) 03/24/2021   MCV 95.7 03/24/2021   PLT 430 (H) 03/24/2021   Lab Results  Component Value Date   NA 138 06/06/2021   K 4.3 06/06/2021   CO2 24 03/24/2021   GLUCOSE 90 06/06/2021   BUN 8 06/06/2021   CREATININE 0.71 06/06/2021   BILITOT 0.4 06/06/2021   ALKPHOS 116 06/06/2021   AST 16 06/06/2021   ALT 22 03/16/2021   PROT 7.0 06/06/2021   ALBUMIN 4.2 06/06/2021   CALCIUM 9.9 06/06/2021   ANIONGAP 13 03/24/2021   EGFR 100 06/06/2021   Lab Results  Component Value Date   CHOL 158 06/06/2021   Lab Results  Component Value Date   HDL 63 06/06/2021   Lab Results  Component Value Date   LDLCALC 78 06/06/2021   Lab Results  Component Value Date   TRIG 89 06/06/2021   Lab Results  Component Value Date   CHOLHDL 2.5 06/06/2021   Lab Results  Component Value Date   HGBA1C 5.0 06/06/2021      Assessment & Plan:   Problem List Items Addressed This Visit       Cardiovascular and Mediastinum   Hypertensive disorder - Primary Stable and controlled Encouraged on going compliance with current medication regimen Encouraged home monitoring and recording BP <130/80 Eating a heart-healthy diet with less salt Encouraged regular physical activity     Relevant Orders   CBC with Differential/Platelet (Completed)   Other Visit Diagnoses     Episode of recurrent major depressive disorder, unspecified depression episode severity ([place])     Worsening  Started Mirtazapine 15 mg  Discussed Medicines that relieve depression Discussed counseling (with a psychiatrist, psychologist, or Education officer, museum)    Relevant Medications   mirtazapine (REMERON) 15 MG tablet   Other Relevant Orders   Ambulatory referral to Psychiatry   Screening for deficiency anemia       Relevant Orders   Comp. Metabolic Panel (12) (Completed)       Meds  ordered this encounter  Medications   mirtazapine (REMERON) 15 MG tablet    Sig: Take 1 tablet (15 mg total) by mouth at bedtime.    Dispense:  30 tablet    Refill:  5    Order Specific Question:   Supervising Provider    Answer:   Tresa Garter [id number]    Follow-up: Return in about 6 weeks (around 01/19/2022) for depression [address].    Vevelyn Francois, NP

## 2021-12-09 ENCOUNTER — Other Ambulatory Visit: Payer: Self-pay

## 2021-12-09 ENCOUNTER — Other Ambulatory Visit: Payer: Self-pay | Admitting: Nurse Practitioner

## 2021-12-09 ENCOUNTER — Telehealth: Payer: Self-pay | Admitting: Clinical

## 2021-12-09 LAB — CBC WITH DIFFERENTIAL/PLATELET
Basophils Absolute: 0 10*3/uL (ref 0.0–0.2)
Basos: 1 %
EOS (ABSOLUTE): 0.1 10*3/uL (ref 0.0–0.4)
Eos: 2 %
Hematocrit: 40.7 % (ref 34.0–46.6)
Hemoglobin: 13.5 g/dL (ref 11.1–15.9)
Immature Grans (Abs): 0 10*3/uL (ref 0.0–0.1)
Immature Granulocytes: 0 %
Lymphocytes Absolute: 2 10*3/uL (ref 0.7–3.1)
Lymphs: 41 %
MCH: 31.8 pg (ref 26.6–33.0)
MCHC: 33.2 g/dL (ref 31.5–35.7)
MCV: 96 fL (ref 79–97)
Monocytes Absolute: 0.5 10*3/uL (ref 0.1–0.9)
Monocytes: 10 %
Neutrophils Absolute: 2.2 10*3/uL (ref 1.4–7.0)
Neutrophils: 46 %
Platelets: 298 10*3/uL (ref 150–450)
RBC: 4.24 x10E6/uL (ref 3.77–5.28)
RDW: 12.8 % (ref 11.7–15.4)
WBC: 4.9 10*3/uL (ref 3.4–10.8)

## 2021-12-09 LAB — COMP. METABOLIC PANEL (12)
AST: 23 IU/L (ref 0–40)
Albumin/Globulin Ratio: 1.8 (ref 1.2–2.2)
Albumin: 4.4 g/dL (ref 3.8–4.9)
Alkaline Phosphatase: 72 IU/L (ref 44–121)
BUN/Creatinine Ratio: 19 (ref 9–23)
BUN: 13 mg/dL (ref 6–24)
Bilirubin Total: 0.3 mg/dL (ref 0.0–1.2)
Calcium: 9.3 mg/dL (ref 8.7–10.2)
Chloride: 107 mmol/L — ABNORMAL HIGH (ref 96–106)
Creatinine, Ser: 0.7 mg/dL (ref 0.57–1.00)
Globulin, Total: 2.4 g/dL (ref 1.5–4.5)
Glucose: 95 mg/dL (ref 70–99)
Potassium: 4.5 mmol/L (ref 3.5–5.2)
Sodium: 141 mmol/L (ref 134–144)
Total Protein: 6.8 g/dL (ref 6.0–8.5)
eGFR: 102 mL/min/{1.73_m2} (ref >59)

## 2021-12-09 MED ORDER — MIRTAZAPINE 15 MG PO TABS
15.0000 mg | ORAL_TABLET | Freq: Every day | ORAL | 5 refills | Status: DC
  Filled 2021-12-09: qty 30, 30d supply, fill #0
  Filled 2022-03-17: qty 30, 30d supply, fill #1

## 2021-12-09 NOTE — Telephone Encounter (Signed)
Integrated Behavioral Health Case Management Referral Note  12/09/2021 Name: Tracy Fischer MRN: [id number] DOB: 05-Feb-1966 Tracy Fischer is a 56 y.o. year old female who sees Vevelyn Francois, NP for primary care. LCSW was consulted to assess patient's needs and assist the patient with Mental Health Counseling and Resources.  Interpreter: No.   Interpreter Name & Language: none  Assessment: Patient experiencing Mental Health Concerns . Patient was referred to [facility]) by provider and prescribed medication for depression.  Intervention: CSW called patient today and advised of [facility] walk-in hours. Also offered assistance in scheduling appointment with counselor at [facility]; patient would like assistance with this. CSW to follow.  Review of patient status, including review of consultants reports, relevant laboratory and other test results, and collaboration with appropriate care team members and the patient's provider was performed as part of comprehensive patient evaluation and provision of services.    Estanislado Emms, [facility] [phone]

## 2021-12-12 ENCOUNTER — Telehealth: Payer: Self-pay | Admitting: Clinical

## 2021-12-12 ENCOUNTER — Other Ambulatory Visit: Payer: Self-pay

## 2021-12-12 NOTE — Telephone Encounter (Signed)
Integrated Behavioral Health General Follow Up Note  12/12/2021 Name: Tracy Fischer MRN: [id number] DOB: [birth year] [name] is a 56 y.o. year old female who sees Vevelyn Francois, NP for primary care. LCSW was consulted to assess patient's needs and assist the patient with Mental Health Counseling and Resources.  Interpreter: No.   Interpreter Name & Language: none  Assessment: Patient experiencing Mental Health Concerns . Patient was referred to [facility]) by provider and prescribed medication for depression.  Ongoing Intervention: Today CSW scheduled patient appointment at [facility] for 02/14/22. Called patient and advised her of this appointment. Reminded her of walk-in option at [facility] as well.   Review of patient status, including review of consultants reports, relevant laboratory and other test results, and collaboration with appropriate care team members and the patient's provider was performed as part of comprehensive patient evaluation and provision of services.    Estanislado Emms, [facility] [phone]

## 2022-01-02 ENCOUNTER — Other Ambulatory Visit: Payer: Self-pay

## 2022-01-02 ENCOUNTER — Encounter: Payer: Self-pay | Admitting: Obstetrics and Gynecology

## 2022-01-02 ENCOUNTER — Ambulatory Visit (INDEPENDENT_AMBULATORY_CARE_PROVIDER_SITE_OTHER): Payer: Self-pay | Admitting: Obstetrics and Gynecology

## 2022-01-02 DIAGNOSIS — N9489 Other specified conditions associated with female genital organs and menstrual cycle: Secondary | ICD-10-CM

## 2022-01-02 NOTE — Progress Notes (Addendum)
New GYN Pt presents for AEX/PAP. C/o left pelvic mass, pain 2/10.  Patient has never had a Mammogram.

## 2022-01-02 NOTE — Progress Notes (Signed)
Tracy Fischer presents for left adnexal mass noted on CT scan from 5/22 at time of ruptured appendix. Pelvic adhesive disease noted at that time See OP  note for additional information. H/O c section and BTL Needs pap smear and mammogram Post menopausal H/O didelphys cervix and uterus, per pt.  PE AF VSS Lungs clear Heart RRR Abd soft + BS  A/P Left adnexal mass        HM, needs pap smear and mammogram        Didelphys cervix and uterus  Pt only has Family Planning, not sure this is correct as pt is 72 and post menopausal. Pt advised to speak with Medicaid about switch to straight Medicaid to cover HM and need for GYN U/S. Pt to let us know if she qualifies and if so will reschedule HM items or refer to [facility] program. I suspect that left adnexal mass is left uterus. F/u as indicated above. Pt is agreeable to this plan and was thankful because she stated she did not  have the means to pay.

## 2022-01-05 ENCOUNTER — Other Ambulatory Visit: Payer: Self-pay | Admitting: Obstetrics and Gynecology

## 2022-01-05 DIAGNOSIS — Z1231 Encounter for screening mammogram for malignant neoplasm of breast: Secondary | ICD-10-CM

## 2022-01-11 ENCOUNTER — Other Ambulatory Visit: Payer: Self-pay

## 2022-01-20 ENCOUNTER — Other Ambulatory Visit: Payer: Self-pay

## 2022-01-20 ENCOUNTER — Ambulatory Visit (INDEPENDENT_AMBULATORY_CARE_PROVIDER_SITE_OTHER): Payer: Self-pay | Admitting: Nurse Practitioner

## 2022-01-20 DIAGNOSIS — Z09 Encounter for follow-up examination after completed treatment for conditions other than malignant neoplasm: Secondary | ICD-10-CM

## 2022-01-20 NOTE — Progress Notes (Deleted)
   [facility] [address] [place], [place]  [address] Phone:  [phone]   Fax:  [phone] 

## 2022-01-25 ENCOUNTER — Ambulatory Visit (INDEPENDENT_AMBULATORY_CARE_PROVIDER_SITE_OTHER): Payer: Self-pay | Admitting: Nurse Practitioner

## 2022-01-25 ENCOUNTER — Other Ambulatory Visit: Payer: Self-pay

## 2022-01-25 ENCOUNTER — Encounter: Payer: Self-pay | Admitting: Nurse Practitioner

## 2022-01-25 VITALS — BP 133/83 | HR 73 | Temp 98.0°F | Ht 65.0 in | Wt 128.0 lb

## 2022-01-25 DIAGNOSIS — I10 Essential (primary) hypertension: Secondary | ICD-10-CM

## 2022-01-25 DIAGNOSIS — F419 Anxiety disorder, unspecified: Secondary | ICD-10-CM

## 2022-01-25 NOTE — Progress Notes (Signed)
? ?[facility] ?[place], [place]  [address] ?Phone:  [phone]   Fax:  [phone] ? ? ?Established Patient Office Visit ? ?Subjective:  ?Patient ID: Tracy Fischer, female    DOB: 25-Jan-1966  Age: 56 y.o. MRN: [id number] ? ?CC:  ?Chief Complaint  ?Patient presents with  ? Follow-up  ?  Pt is here for 6 month follow up. Pt stated her anxiety went down and your able to sleep through the night  ? ? ?HPI ?Tracy Fischer presents for follow up. She  has a past medical history of Anxiety, Asthma, Depression, Fibroid, Hypertension, Preterm labor, and Vaginal Pap smear, abnormal.  ? ?She is in today for follow up. She was started on mirtazapine 15 mg nightly to help with anxiety, sleep and to increase appetite/weight.  She does report feeling sluggish the next day. Denies headache, dizziness, visual changes, shortness of breath, dyspnea on exertion, chest pain, nausea, vomiting or any edema.  ? ? ?Past Medical History:  ?Diagnosis Date  ? Anxiety   ? Asthma   ? Depression   ? Fibroid   ? Hypertension   ? Preterm labor   ? Vaginal Pap smear, abnormal   ? ? ?Past Surgical History:  ?Procedure Laterality Date  ? CESAREAN SECTION    ? CESAREAN SECTION    ? LAPAROSCOPIC APPENDECTOMY N/A 03/17/2021  ? Procedure: APPENDECTOMY LAPAROSCOPIC;  Surgeon: Felicie Morn, MD;  Location: [facility];  Service: General;  Laterality: N/A;  ? ? ?Family History  ?Problem Relation Age of Onset  ? Diabetes Mellitus II Father   ? Hypertension Father   ? Cancer Maternal Aunt   ? ? ?Social History  ? ?Socioeconomic History  ? Marital status: Single  ?  Spouse name: Not on file  ? Number of children: 2  ? Years of education: Not on file  ? Highest education level: Not on file  ?Occupational History  ? Not on file  ?Tobacco Use  ? Smoking status: Every Day  ?  Packs/day: 1.00  ?  Years: 30.00  ?  Pack years: 30.00  ?  Types: Cigarettes  ? Smokeless tobacco: Never  ? Tobacco comments:  ?  1 Pack every 2  to 3 days  ?Vaping Use  ? Vaping Use: Never used  ?Substance and Sexual Activity  ? Alcohol use: Yes  ?  Comment: weekly wine or liquor  ? Drug use: Yes  ?  Types: Marijuana  ?  Comment: helps with nausea (increased over the last few months  ? Sexual activity: Not Currently  ?  Partners: Male  ?  Birth control/protection: Post-menopausal  ?Other Topics Concern  ? Not on file  ?Social History Narrative  ? Not on file  ? ?Social Determinants of Health  ? ?Financial Resource Strain: Not on file  ?Food Insecurity: Not on file  ?Transportation Needs: Not on file  ?Physical Activity: Not on file  ?Stress: Not on file  ?Social Connections: Not on file  ?Intimate Partner Violence: Not on file  ? ? ?Outpatient Medications Prior to Visit  ?Medication Sig Dispense Refill  ? acetaminophen (TYLENOL) 500 MG tablet Take 2 tablets (1,000 mg total) by mouth every 8 (eight) hours as needed for mild pain. 30 tablet 0  ? amLODipine (NORVASC) 10 MG tablet Take 1 tablet (10 mg total) by mouth daily. 90 tablet 3  ? carvedilol (COREG) 3.125 MG tablet Take 1 tablet (3.125 mg total) by mouth  2 (two) times daily with a meal. 180 tablet 3  ? mirtazapine (REMERON) 15 MG tablet Take 1 tablet (15 mg total) by mouth at bedtime. 30 tablet 5  ? ranitidine (ZANTAC) 75 MG tablet Take 75 mg by mouth 2 (two) times daily as needed for heartburn.    ? ?No facility-administered medications prior to visit.  ? ? ?No Known Allergies ? ?ROS ?Review of Systems ? ?  ?Objective:  ?  ?Physical Exam ?Constitutional:   ?   Appearance: She is normal weight.  ?HENT:  ?   Head: Normocephalic and atraumatic.  ?Cardiovascular:  ?   Rate and Rhythm: Normal rate and regular rhythm.  ?   Pulses: Normal pulses.  ?   Heart sounds: Normal heart sounds.  ?Pulmonary:  ?   Effort: Pulmonary effort is normal.  ?   Breath sounds: Normal breath sounds.  ?Musculoskeletal:     ?   General: Normal range of motion.  ?   Cervical back: Normal range of motion.  ?Skin: ?   General: Skin is  warm.  ?   Capillary Refill: Capillary refill takes less than 2 seconds.  ?Neurological:  ?   General: No focal deficit present.  ?   Mental Status: She is alert and oriented to person, place, and time.  ? ? ?BP 133/83 (BP Location: Right Arm, Patient Position: Sitting, Cuff Size: Small)   Pulse 73   Temp 98 ?F (36.7 ?C)   Ht $R'5\' 5"'Gs$  (1.651 m)   Wt 128 lb (58.1 kg)   LMP 04/21/2015 (Approximate) Comment: Perimenopausal  SpO2 100%   BMI 21.30 kg/m?  ?Wt Readings from Last 3 Encounters:  ?01/25/22 128 lb (58.1 kg)  ?01/02/22 127 lb (57.6 kg)  ?12/08/21 126 lb (57.2 kg)  ? ? ? ?Health Maintenance Due  ?Topic Date Due  ? PAP SMEAR-Modifier  Never done  ? ? ? ?There are no preventive care reminders to display for this patient. ? ?No results found for: TSH ?Lab Results  ?Component Value Date  ? WBC 4.9 12/08/2021  ? HGB 13.5 12/08/2021  ? HCT 40.7 12/08/2021  ? MCV 96 12/08/2021  ? PLT 298 12/08/2021  ? ?Lab Results  ?Component Value Date  ? NA 141 12/08/2021  ? K 4.5 12/08/2021  ? CO2 24 03/24/2021  ? GLUCOSE 95 12/08/2021  ? BUN 13 12/08/2021  ? CREATININE 0.70 12/08/2021  ? BILITOT 0.3 12/08/2021  ? ALKPHOS 72 12/08/2021  ? AST 23 12/08/2021  ? ALT 22 03/16/2021  ? PROT 6.8 12/08/2021  ? ALBUMIN 4.4 12/08/2021  ? CALCIUM 9.3 12/08/2021  ? ANIONGAP 13 03/24/2021  ? EGFR 102 12/08/2021  ? ?Lab Results  ?Component Value Date  ? CHOL 158 06/06/2021  ? ?Lab Results  ?Component Value Date  ? HDL 63 06/06/2021  ? ?Lab Results  ?Component Value Date  ? [place] 78 06/06/2021  ? ?Lab Results  ?Component Value Date  ? TRIG 89 06/06/2021  ? ?Lab Results  ?Component Value Date  ? CHOLHDL 2.5 06/06/2021  ? ?Lab Results  ?Component Value Date  ? HGBA1C 5.0 06/06/2021  ? ? ?  ?Assessment & Plan:  ? ?Problem List Items Addressed This Visit   ? ?  ? Cardiovascular and Mediastinum  ? Hypertensive disorder ?Stable ?Encouraged on going compliance with current medication regimen ?Encouraged home monitoring and recording BP  <130/80 ?Eating a heart-healthy diet with less salt ?Encouraged regular physical activity  ?Recommend Weight loss ? ?  ? ?  Other Visit Diagnoses   ? ? Anxiety    -  Primary ?Stable ?Decrease Mirtazapine 15 mg 0.5 tab qhs  ? ?  ? ? ?No orders of the defined types were placed in this encounter. ? ? ?Follow-up: No follow-ups on file.  ? ? ?Vevelyn Francois, NP ?

## 2022-01-27 NOTE — Progress Notes (Signed)
No apt

## 2022-02-03 ENCOUNTER — Telehealth: Payer: Self-pay | Admitting: Clinical

## 2022-02-03 NOTE — Telephone Encounter (Signed)
[facility] ?General Follow Up Note ? ?02/03/2022 ?Name: Tracy Fischer MRN: [id number] DOB: 04-Jul-1966 ?Tracy Fischer is a 56 y.o. year old female who sees Tracy Francois, NP for primary care. LCSW was consulted to assess patient's needs and assist the patient with Mental Health Counseling and Resources. ? ?Interpreter: No.   Interpreter Name & Language: none ? ?Assessment: Patient experiencing Mental Health Concerns . Patient was referred to [facility] by provider and prescribed medication for depression. ? ?Ongoing Intervention: Today CSW called patient and reminded her of appointment at [facility] for 02/14/22. Patient is aware of the appointment and plans to attend.  ? ?Review of patient status, including review of consultants reports, relevant laboratory and other test results, and collaboration with appropriate care team members and the patient's provider was performed as part of comprehensive patient evaluation and provision of services.   ? ?Tracy Emms, LCSW ?Patient Tracy Fischer ?Tracy Fischer ?[phone] ?  ? ?

## 2022-02-13 ENCOUNTER — Other Ambulatory Visit: Payer: Self-pay

## 2022-02-14 ENCOUNTER — Other Ambulatory Visit: Payer: Self-pay

## 2022-02-14 ENCOUNTER — Telehealth (HOSPITAL_COMMUNITY): Payer: Self-pay | Admitting: Licensed Clinical Social Worker

## 2022-02-14 ENCOUNTER — Ambulatory Visit (HOSPITAL_COMMUNITY): Payer: No Payment, Other | Admitting: Licensed Clinical Social Worker

## 2022-02-14 ENCOUNTER — Encounter (HOSPITAL_COMMUNITY): Payer: Self-pay

## 2022-02-14 NOTE — Telephone Encounter (Signed)
LCSW sent links to patient's cell phone listed in epic at 901 and at 905 without any response.  LCSW followed up with a phone call at 910 phone went to voicemail and voicemail stated "the voicemail box is full and cannot accept voicemails at this time".  LCSW sent another link at 911 with no response.  LCSW followed up with a phone call at 913 and phone again went to voicemail stating the voicemail box was full.  Patient will be marked as a no-show for today's session.  LCSW waited in links until 915 before disconnecting ?

## 2022-02-16 ENCOUNTER — Ambulatory Visit: Payer: Medicaid Other

## 2022-03-14 ENCOUNTER — Other Ambulatory Visit: Payer: Self-pay

## 2022-03-17 ENCOUNTER — Other Ambulatory Visit: Payer: Self-pay

## 2022-03-20 ENCOUNTER — Other Ambulatory Visit: Payer: Self-pay

## 2022-04-18 ENCOUNTER — Ambulatory Visit (HOSPITAL_COMMUNITY): Payer: No Payment, Other | Admitting: Licensed Clinical Social Worker

## 2022-04-18 ENCOUNTER — Other Ambulatory Visit: Payer: Self-pay

## 2022-04-25 ENCOUNTER — Other Ambulatory Visit: Payer: Self-pay

## 2022-04-27 ENCOUNTER — Other Ambulatory Visit: Payer: Self-pay

## 2022-04-28 ENCOUNTER — Ambulatory Visit: Payer: Self-pay | Admitting: Nurse Practitioner

## 2022-05-02 ENCOUNTER — Ambulatory Visit (INDEPENDENT_AMBULATORY_CARE_PROVIDER_SITE_OTHER): Payer: Self-pay | Admitting: Nurse Practitioner

## 2022-05-02 ENCOUNTER — Other Ambulatory Visit: Payer: Self-pay

## 2022-05-02 ENCOUNTER — Encounter: Payer: Self-pay | Admitting: Nurse Practitioner

## 2022-05-02 VITALS — BP 128/84 | HR 81 | Temp 97.3°F | Ht 65.0 in | Wt 134.6 lb

## 2022-05-02 DIAGNOSIS — I10 Essential (primary) hypertension: Secondary | ICD-10-CM

## 2022-05-02 DIAGNOSIS — F339 Major depressive disorder, recurrent, unspecified: Secondary | ICD-10-CM

## 2022-05-02 DIAGNOSIS — F419 Anxiety disorder, unspecified: Secondary | ICD-10-CM

## 2022-05-02 MED ORDER — AMLODIPINE BESYLATE 10 MG PO TABS
10.0000 mg | ORAL_TABLET | Freq: Every day | ORAL | 3 refills | Status: DC
  Filled 2022-05-02: qty 90, 90d supply, fill #0
  Filled 2022-05-30: qty 30, 30d supply, fill #0
  Filled 2022-07-17: qty 30, 30d supply, fill #1
  Filled 2022-08-18: qty 30, 30d supply, fill #2
  Filled 2022-10-23: qty 30, 30d supply, fill #3
  Filled 2022-12-11: qty 30, 30d supply, fill #4
  Filled 2023-01-16: qty 90, 90d supply, fill #5

## 2022-05-02 MED ORDER — CARVEDILOL 3.125 MG PO TABS
3.1250 mg | ORAL_TABLET | Freq: Two times a day (BID) | ORAL | 3 refills | Status: DC
  Filled 2022-05-02: qty 60, 30d supply, fill #0
  Filled 2022-05-31: qty 180, 90d supply, fill #0
  Filled 2022-10-23: qty 180, 90d supply, fill #1
  Filled 2023-02-27: qty 180, 90d supply, fill #2

## 2022-05-02 MED ORDER — MIRTAZAPINE 15 MG PO TABS
15.0000 mg | ORAL_TABLET | Freq: Every day | ORAL | 5 refills | Status: DC
  Filled 2022-05-02 – 2022-07-17 (×2): qty 30, 30d supply, fill #0

## 2022-05-02 NOTE — Progress Notes (Signed)
[place], [place]  [address] Phone:  [phone]   Fax:  [phone] Subjective:   Patient ID: Tracy Fischer, female    DOB: 1966/03/21, 56 y.o.   MRN: [id number]  Chief Complaint  Patient presents with   Follow-up    Pt is here for  months follow up visit.   HPI [name] 56 y.o. female  has a past medical history of Anxiety, Asthma, Depression, Fibroid, Hypertension, Preterm labor, and Vaginal Pap smear, abnormal.   Hypertension: Patient here for follow-up of elevated blood pressure. She is not exercising and is not adherent to low salt diet.  Does not check B/P at home.  Cardiac symptoms none. Patient denies chest pain, dyspnea, fatigue, and near-syncope.  Cardiovascular risk factors: hypertension and sedentary lifestyle. Use of agents associated with hypertension: none. History of target organ damage: none. Denies any other concerns today.   Denies any fatigue, chest pain, shortness of breath, HA or dizziness. Denies any blurred vision, numbness or tingling.  Past Medical History:  Diagnosis Date   Anxiety    Asthma    Depression    Fibroid    Hypertension    Preterm labor    Vaginal Pap smear, abnormal     Past Surgical History:  Procedure Laterality Date   CESAREAN SECTION     CESAREAN SECTION     LAPAROSCOPIC APPENDECTOMY N/A 03/17/2021   Procedure: APPENDECTOMY LAPAROSCOPIC;  Surgeon: Stechschulte, Nickola Major, MD;  Location: [facility];  Service: General;  Laterality: N/A;    Family History  Problem Relation Age of Onset   Diabetes Mellitus II Father    Hypertension Father    Cancer Maternal Aunt     Social History   Socioeconomic History   Marital status: Single    Spouse name: Not on file   Number of children: 2   Years of education: Not on file   Highest education level: Not on file  Occupational History   Not on file  Tobacco Use   Smoking status: Every Day    Packs/day: 1.00    Years:  30.00    Total pack years: 30.00    Types: Cigarettes   Smokeless tobacco: Never   Tobacco comments:    1 Pack every 2 to 3 days  Vaping Use   Vaping Use: Never used  Substance and Sexual Activity   Alcohol use: Yes    Comment: weekly wine or liquor   Drug use: Yes    Types: Marijuana    Comment: helps with nausea (increased over the last few months   Sexual activity: Not Currently    Partners: Male    Birth control/protection: Post-menopausal  Other Topics Concern   Not on file  Social History Narrative   Not on file   Social Determinants of Health   Financial Resource Strain: Not on file  Food Insecurity: Not on file  Transportation Needs: Not on file  Physical Activity: Not on file  Stress: Not on file  Social Connections: Not on file  Intimate Partner Violence: Not on file    Outpatient Medications Prior to Visit  Medication Sig Dispense Refill   acetaminophen (TYLENOL) 500 MG tablet Take 2 tablets (1,000 mg total) by mouth every 8 (eight) hours as needed for mild pain. 30 tablet 0   ranitidine (ZANTAC) 75 MG tablet Take 75 mg by mouth 2 (two) times daily as needed for heartburn.  amLODipine (NORVASC) 10 MG tablet Take 1 tablet (10 mg total) by mouth daily. 90 tablet 3   carvedilol (COREG) 3.125 MG tablet Take 1 tablet (3.125 mg total) by mouth 2 (two) times daily with a meal. 180 tablet 3   mirtazapine (REMERON) 15 MG tablet Take 1 tablet (15 mg total) by mouth at bedtime. 30 tablet 5   No facility-administered medications prior to visit.    No Known Allergies  Review of Systems  Constitutional:  Negative for chills, fever and malaise/fatigue.  Respiratory:  Negative for cough and shortness of breath.   Cardiovascular:  Negative for chest pain, palpitations and leg swelling.  Gastrointestinal:  Negative for abdominal pain, blood in stool, constipation, diarrhea, nausea and vomiting.  Musculoskeletal: Negative.   Skin: Negative.   Neurological: Negative.    Psychiatric/Behavioral:  Negative for depression. The patient is not nervous/anxious.   All other systems reviewed and are negative.      Objective:    Physical Exam Vitals reviewed.  Constitutional:      General: She is not in acute distress.    Appearance: Normal appearance. She is normal weight.  HENT:     Head: Normocephalic.  Neck:     Vascular: No carotid bruit.  Cardiovascular:     Rate and Rhythm: Normal rate and regular rhythm.     Pulses: Normal pulses.     Heart sounds: Normal heart sounds.     Comments: No obvious peripheral edema Pulmonary:     Effort: Pulmonary effort is normal.     Breath sounds: Normal breath sounds.  Musculoskeletal:        General: No swelling, tenderness, deformity or signs of injury. Normal range of motion.     Cervical back: Normal range of motion and neck supple. No rigidity or tenderness.     Right lower leg: No edema.     Left lower leg: No edema.  Lymphadenopathy:     Cervical: No cervical adenopathy.  Skin:    General: Skin is warm and dry.     Capillary Refill: Capillary refill takes less than 2 seconds.  Neurological:     General: No focal deficit present.     Mental Status: She is alert and oriented to person, place, and time.  Psychiatric:        Mood and Affect: Mood normal.        Behavior: Behavior normal.        Thought Content: Thought content normal.        Judgment: Judgment normal.     BP 128/84 (BP Location: Right Arm, Patient Position: Sitting, Cuff Size: Normal)   Pulse 81   Temp (!) 97.3 F (36.3 C)   Ht 5' 5"  (1.651 m)   Wt 134 lb 9.6 oz (61.1 kg)   LMP 04/21/2015 (Approximate) Comment: Perimenopausal  SpO2 100%   BMI 22.40 kg/m  Wt Readings from Last 3 Encounters:  05/02/22 134 lb 9.6 oz (61.1 kg)  01/25/22 128 lb (58.1 kg)  01/02/22 127 lb (57.6 kg)    Immunization History  Administered Date(s) Administered   PFIZER(Purple Top)SARS-COV-2 Vaccination 02/07/2020, 02/28/2020, 10/26/2020     Diabetic Foot Exam - Simple   No data filed     No results found for: "TSH" Lab Results  Component Value Date   WBC 4.9 12/08/2021   HGB 13.5 12/08/2021   HCT 40.7 12/08/2021   MCV 96 12/08/2021   PLT 298 12/08/2021   Lab Results  Component Value  Date   NA 141 12/08/2021   K 4.5 12/08/2021   CO2 24 03/24/2021   GLUCOSE 95 12/08/2021   BUN 13 12/08/2021   CREATININE 0.70 12/08/2021   BILITOT 0.3 12/08/2021   ALKPHOS 72 12/08/2021   AST 23 12/08/2021   ALT 22 03/16/2021   PROT 6.8 12/08/2021   ALBUMIN 4.4 12/08/2021   CALCIUM 9.3 12/08/2021   ANIONGAP 13 03/24/2021   EGFR 102 12/08/2021   Lab Results  Component Value Date   CHOL 158 06/06/2021   Lab Results  Component Value Date   HDL 63 06/06/2021   Lab Results  Component Value Date   LDLCALC 78 06/06/2021   Lab Results  Component Value Date   TRIG 89 06/06/2021   Lab Results  Component Value Date   CHOLHDL 2.5 06/06/2021   Lab Results  Component Value Date   HGBA1C 5.0 06/06/2021       Assessment & Plan:   Problem List Items Addressed This Visit       Cardiovascular and Mediastinum   Hypertensive disorder - Primary   Relevant Medications   amLODipine (NORVASC) 10 MG tablet   carvedilol (COREG) 3.125 MG tablet Medications refilled without change  Encouraged to check B/P at home regularly  Encouraged continued diet and exercise efforts  Encouraged continued compliance with medication     Other Visit Diagnoses     Anxiety       Relevant Medications   mirtazapine (REMERON) 15 MG tablet   Episode of recurrent major depressive disorder, unspecified depression episode severity ([place])       Relevant Medications   mirtazapine (REMERON) 15 MG tablet   Follow up in 6 mths for wellness visit, sooner as needed    I have changed Renay L. Jarriel's amLODipine and mirtazapine. I am also having her maintain her ranitidine, acetaminophen, and carvedilol.  Meds ordered this encounter   Medications   amLODipine (NORVASC) 10 MG tablet    Sig: Take 1 tablet (10 mg total) by mouth once daily.    Dispense:  90 tablet    Refill:  3   carvedilol (COREG) 3.125 MG tablet    Sig: Take 1 tablet (3.125 mg total) by mouth 2 (two) times daily with a meal.    Dispense:  180 tablet    Refill:  3   mirtazapine (REMERON) 15 MG tablet    Sig: Take 1 tablet (15 mg total) by mouth once at bedtime.    Dispense:  30 tablet    Refill:  5     Teena Dunk, NP

## 2022-05-02 NOTE — Patient Instructions (Signed)
You were seen today in the [facility] for reevaluation of chronic illness.  You were prescribed medications, please take as directed. Please follow up in 6 mths for wellness visit  

## 2022-05-08 ENCOUNTER — Other Ambulatory Visit: Payer: Self-pay

## 2022-05-15 ENCOUNTER — Other Ambulatory Visit: Payer: Self-pay | Admitting: Obstetrics and Gynecology

## 2022-05-15 DIAGNOSIS — N9489 Other specified conditions associated with female genital organs and menstrual cycle: Secondary | ICD-10-CM

## 2022-05-30 ENCOUNTER — Other Ambulatory Visit: Payer: Self-pay

## 2022-05-31 ENCOUNTER — Other Ambulatory Visit: Payer: Self-pay

## 2022-07-17 ENCOUNTER — Other Ambulatory Visit: Payer: Self-pay

## 2022-07-18 ENCOUNTER — Other Ambulatory Visit: Payer: Self-pay

## 2022-08-18 ENCOUNTER — Other Ambulatory Visit: Payer: Self-pay

## 2022-09-06 IMAGING — CR DG ABDOMEN ACUTE W/ 1V CHEST
3 series · 3 of 3 positions shown · non-contrast
Comparison: None.

CLINICAL DATA: 54-year-old female with abdominal pain.

EXAM:
DG ABDOMEN ACUTE WITH 1 VIEW CHEST

[w chest pa]
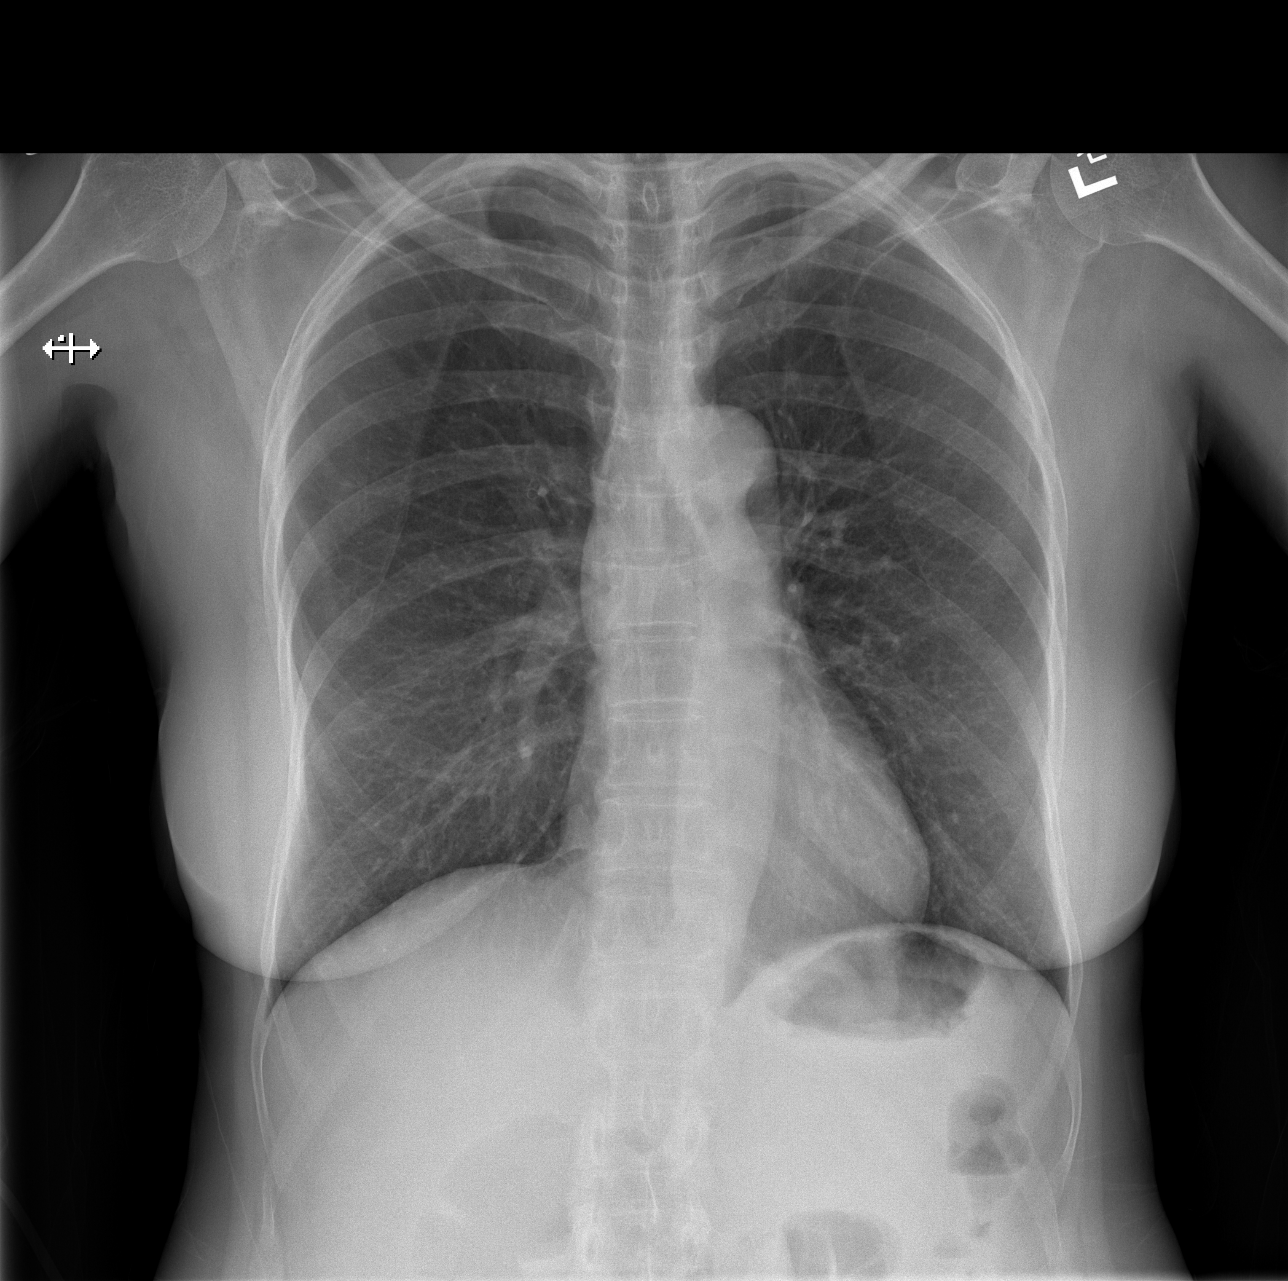

[w abdomen upright]
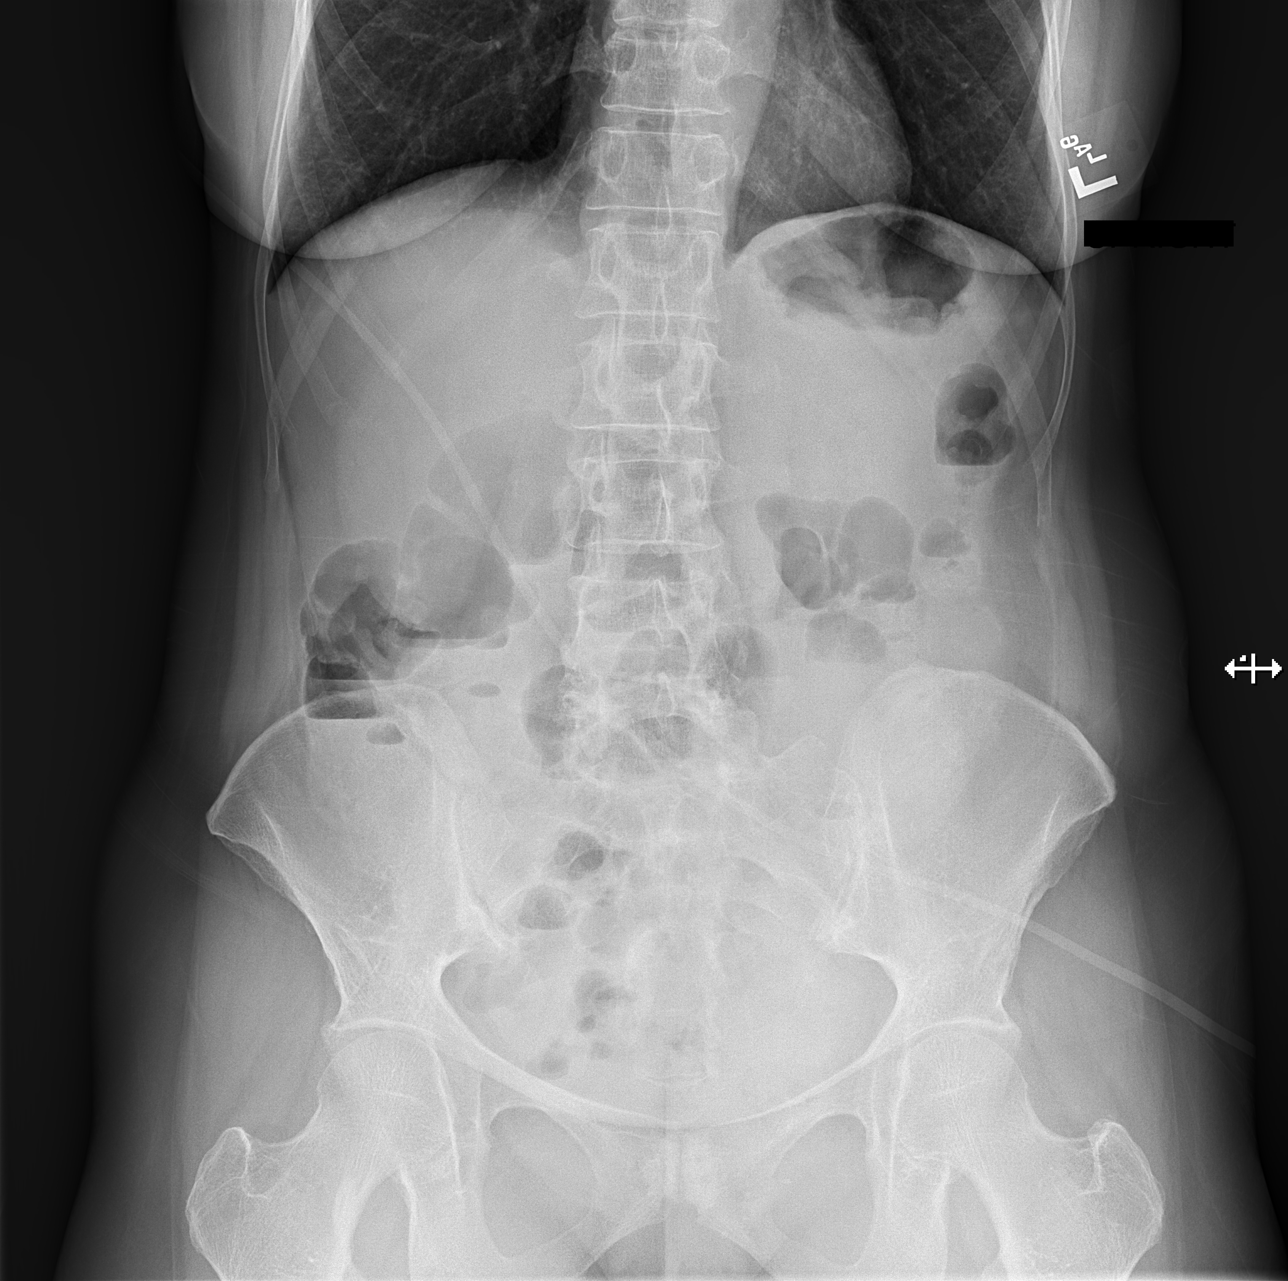

[x abdomen supine]
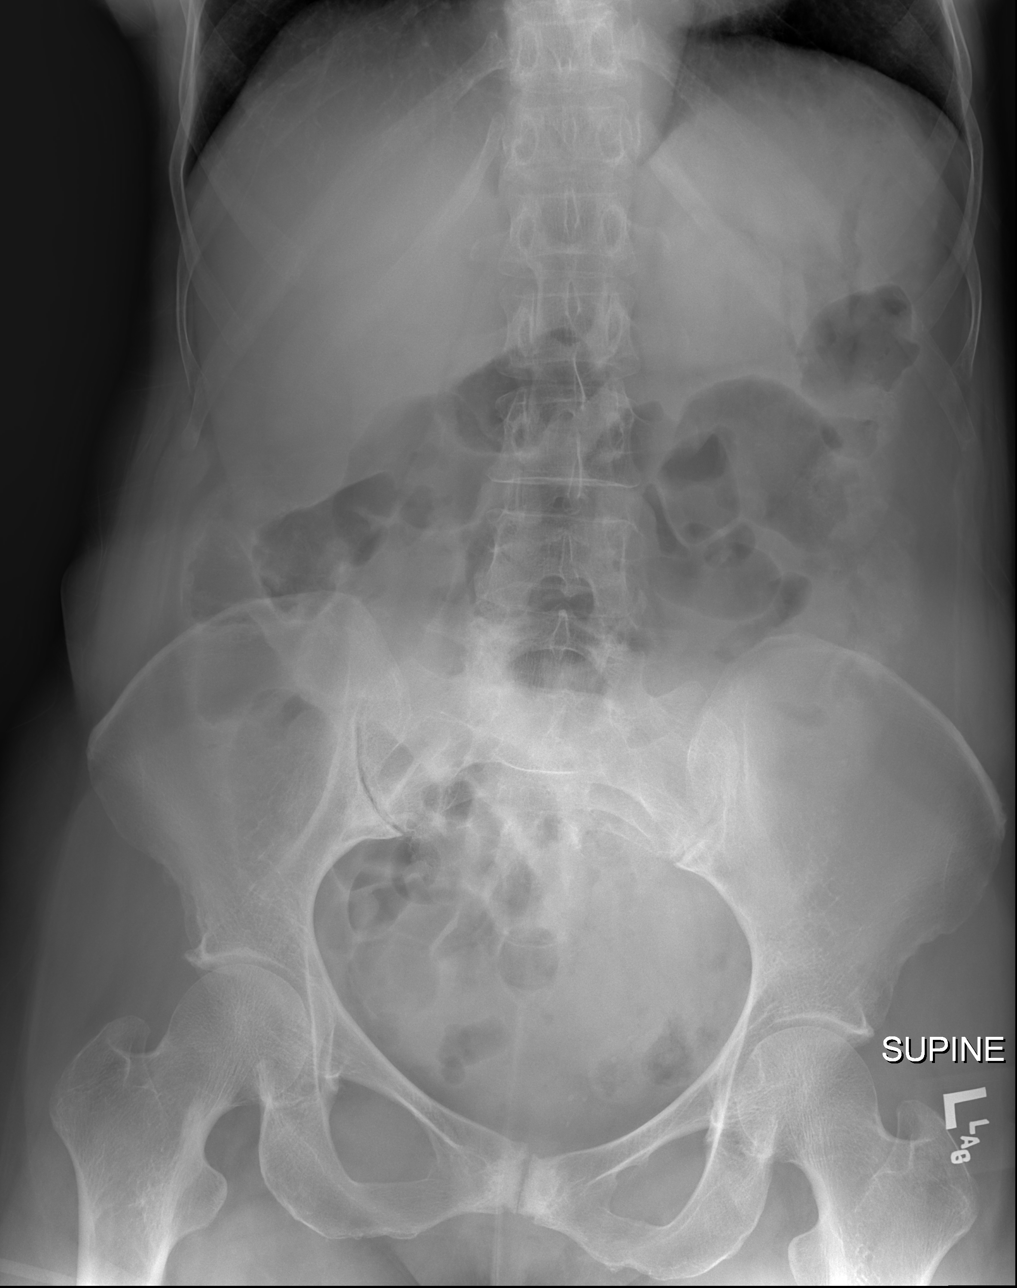

[3 of 3 positions shown; findings below may reference images not displayed]

FINDINGS: The lungs are clear. There is no pleural effusion pneumothorax. The
cardiac silhouette is within limits. There is no bowel dilatation or
evidence of obstruction. No free air or radiopaque calculi. The
osseous structures are unremarkable.
IMPRESSION: Negative abdominal radiographs. No acute cardiopulmonary disease.

## 2022-09-12 IMAGING — CT CT ABD-PELV W/ CM
2 of 6 series · 14 of 46 positions shown, 16 images · IV contrast (APPLIED)
Comparison: Previous exam of March 16, 2021.

CLINICAL DATA: Abdominal pain and fever, postop abdominal pain
following appendectomy.

EXAM:
CT ABDOMEN AND PELVIS WITH CONTRAST
TECHNIQUE: Multidetector CT imaging of the abdomen and pelvis was performed
using the standard protocol following bolus administration of
intravenous contrast.
CONTRAST:  100mL OMNIPAQUE IOHEXOL 300 MG/ML  SOLN

[Series 2: axial st · axial · 0.77mm/px · z∈[-614,-239]mm · 11 of 89 slices shown, 13 images]
[im 7/89  soft-tissue]
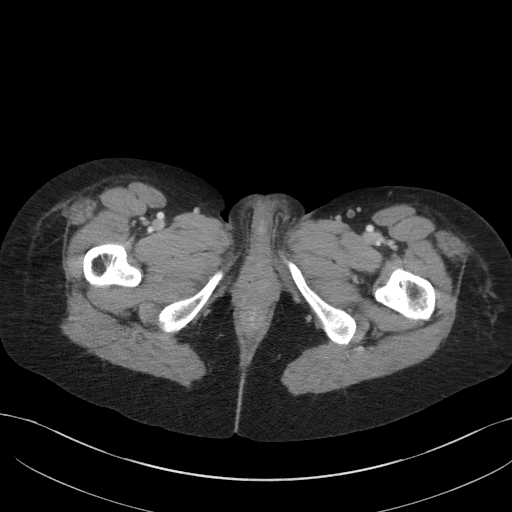
[im 7/89  bone]
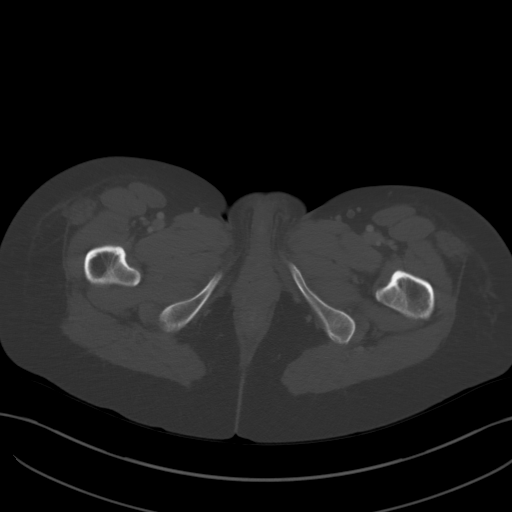
[im 14/89  soft-tissue]
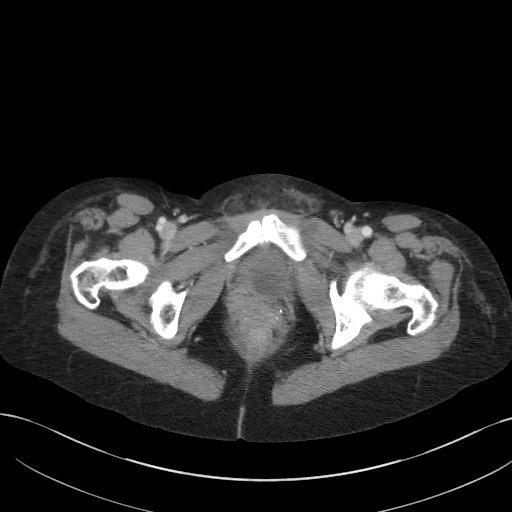
[im 21/89  soft-tissue]
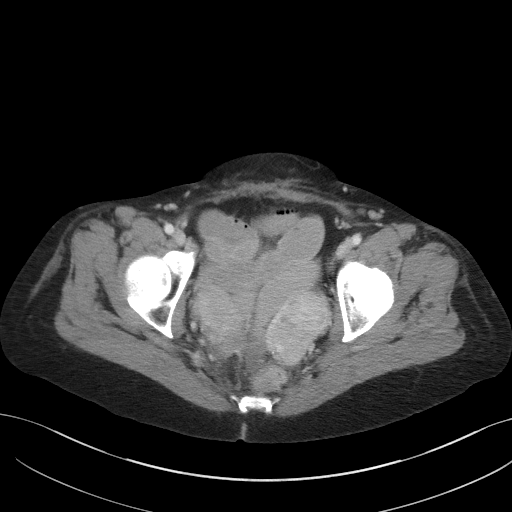
[im 28/89  soft-tissue]
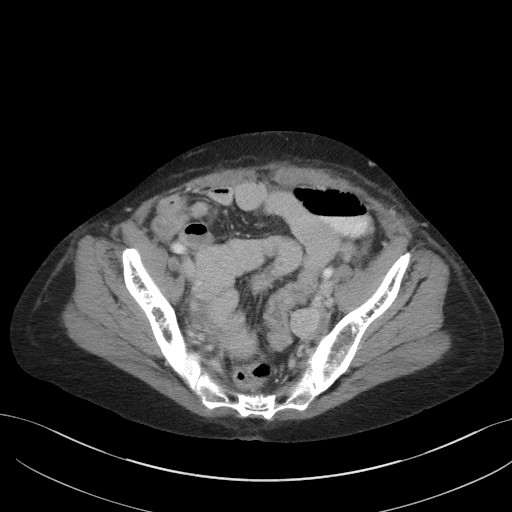
[im 34/89  soft-tissue]
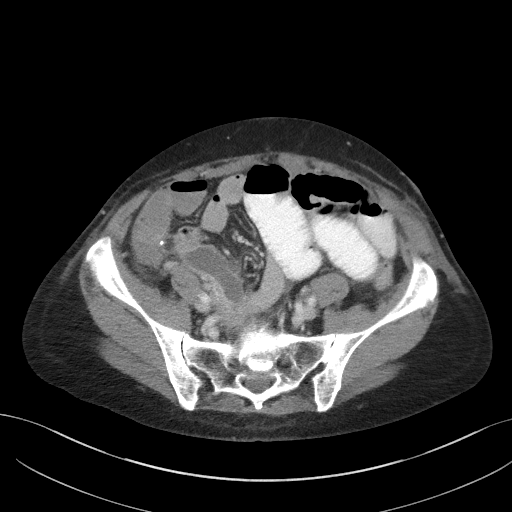
[im 48/89  soft-tissue]
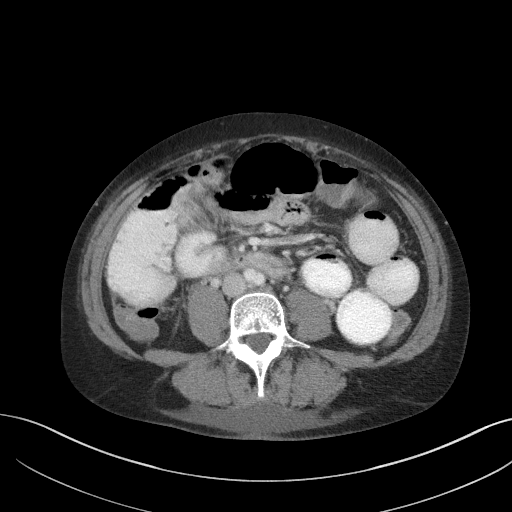
[im 55/89  soft-tissue]
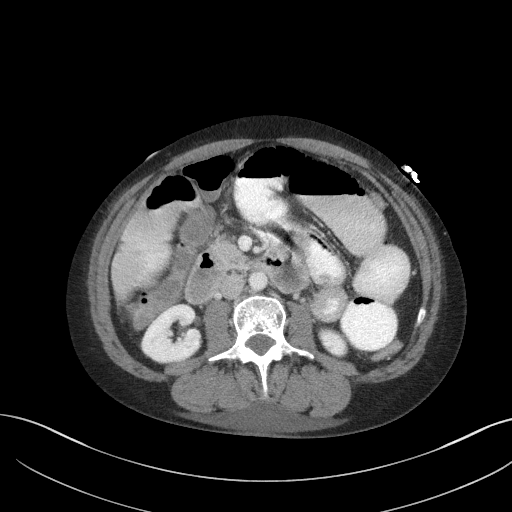
[im 61/89  soft-tissue]
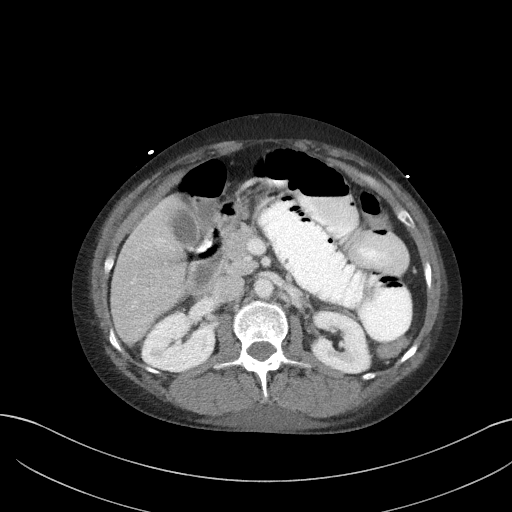
[im 68/89  soft-tissue]
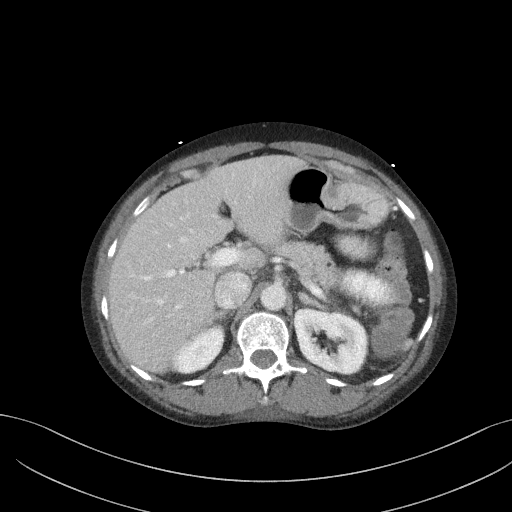
[im 68/89  bone]
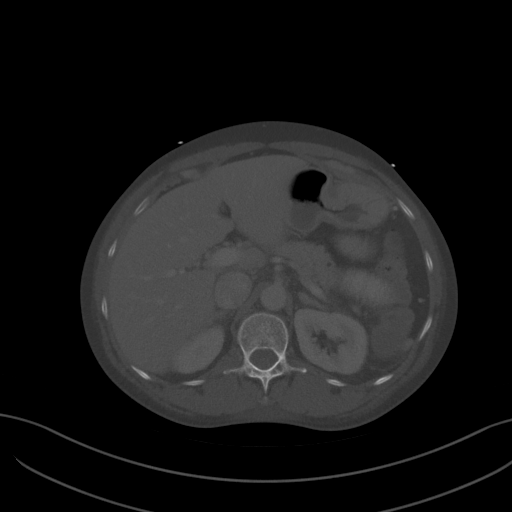
[im 75/89  soft-tissue]
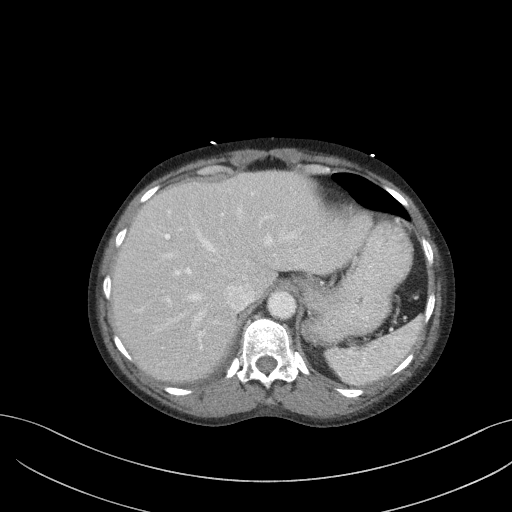
[im 82/89  soft-tissue]
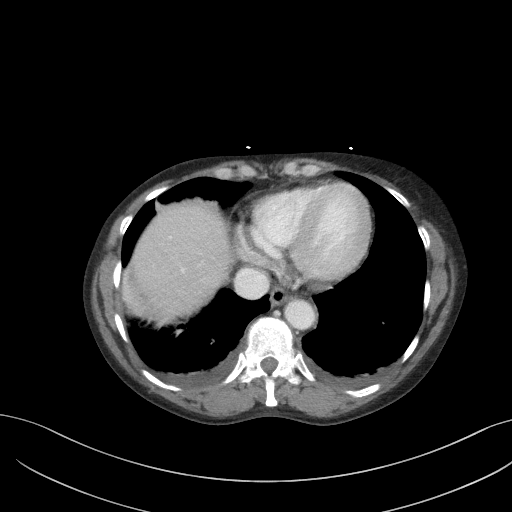

[Series 5: coronal st · coronal · 0.68mm/px · 3 of 90 slices shown]
[im 30/90  soft-tissue]
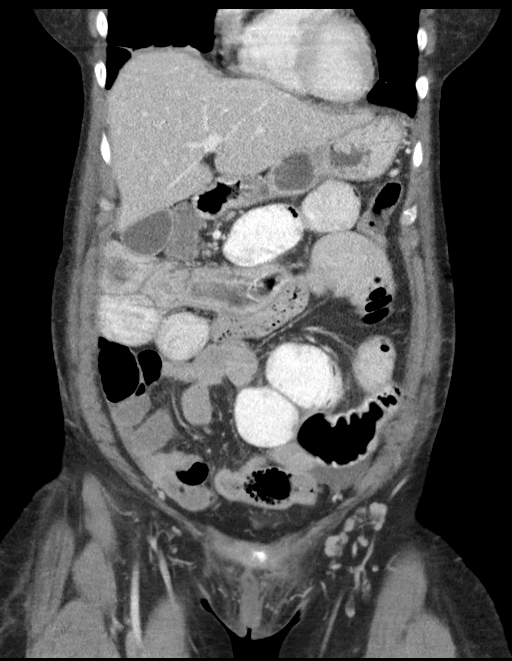
[im 40/90  soft-tissue]
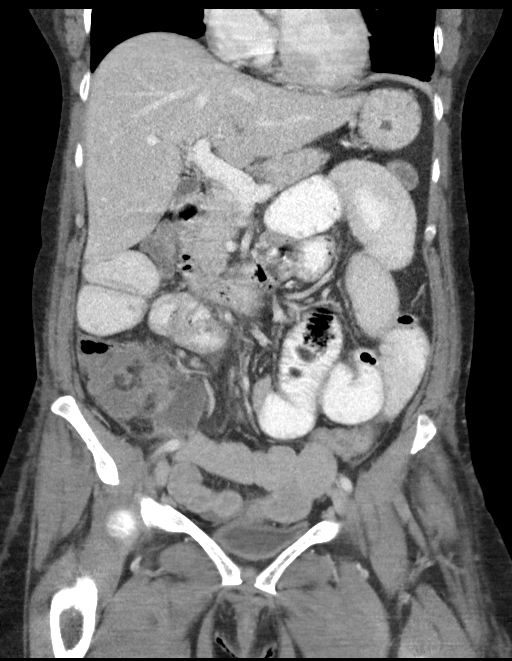
[im 50/90  soft-tissue]
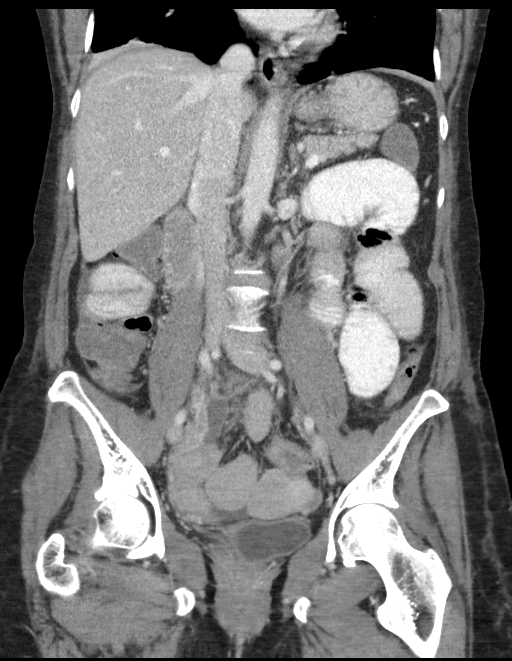

[14 of 46 positions shown; findings below may reference images not displayed]

FINDINGS: Lower chest: Incidental imaging of the lung bases with small
effusions and basilar atelectasis.

Hepatobiliary: No focal, suspicious hepatic lesion. Portal vein is
patent. SMV is patent. Gallbladder is normal. No biliary duct
dilation.

Pancreas: Normal, without mass, inflammation or ductal dilatation.

Spleen: Normal spleen.

Adrenals/Urinary Tract: Adrenal glands are normal.

Symmetric renal enhancement. No hydronephrosis. Urinary bladder
collapsed limiting assessment.

Stomach/Bowel: Oral contrast media was administered passing beyond
the proximal small bowel into the mid small bowel. This becomes more
dilated as it passes distally into the proximal ileum where there is
a gradual transition in the RIGHT lower quadrant. Dilute contrast
passes beyond this location. The colon is collapsed distally. Small
bowel loops are dilated proximally and less dilated distally though
not entirely collapse. Maximal caliber nearing 4 cm of proximal
small bowel loops.

In the area of previous appendectomy is a fluid collection, tubular
collection measuring 5.4 x 1.9 cm (image 55/2) overlying the RIGHT
psoas muscle. There is an adjacent tubular enhancing structure with
some internal calcification that may be part of the ovary based on
the relationship to the RIGHT adnexa and the ovarian vein. There is
generalized stranding in this location which is adjacent to the site
of bowel transition. No signs of pneumatosis. Small amounts of fluid
are present in the LEFT pelvis nonfocal. No pneumoperitoneum. No
signs of gas near the site of appendiceal ligation in the RIGHT
lower quadrant near the cecum.

Vascular/Lymphatic: Patent abdominal vasculature. There is no
gastrohepatic or hepatoduodenal ligament lymphadenopathy. No
retroperitoneal or mesenteric lymphadenopathy.

No pelvic sidewall lymphadenopathy.

Reproductive: The uterus based on a linear hypodense stripe appears
to extend into the RIGHT pelvis rather than the LEFT. In the LEFT
pelvis there is a heterogeneous area of masslike enhancement
measuring 5.9 x 3.5 x 6.7 cm. This is not entirely clear.
Particularly since CT shows limited assessment

Other: Fluid collection in the RIGHT pelvis. Small amount of fluid
in the LEFT hemipelvis measuring approximately 2 x 1.6 cm without
peripheral enhancement. Minimal peripheral enhancement is
demonstrated along the margin of the dominant collection. Scattered
lymph nodes are present in the ileocolic mesentery compatible with
inflammation/reactive changes.

Musculoskeletal: No acute musculoskeletal process. Spinal
degenerative changes.
IMPRESSION: 1. Postoperative fluid collection in the RIGHT lower quadrant
associated with proximal bowel dilation, suspect ileus in the
setting of inflammatory collection/abscess. Correlate with signs of
infection and suggest follow-up to exclude developing partial small
bowel obstruction.
2. LEFT pelvic masslike area in the LEFT adnexa potentially ovarian
mass. Suggest follow-up pelvic sonogram for further assessment. The
uterus may actually extend into the RIGHT pelvis based on
low-density "stripe" passing through an area in the RIGHT pelvis
that appears to represent a small uterus. Solid ovarian neoplasm is
not excluded.
3. Small amount of fluid also in the anterior LEFT pelvis may be
reactive fluid, attention on follow-up.

These results will be called to the ordering clinician or
representative by the Radiologist Assistant, and communication
documented in the PACS or [REDACTED].

## 2022-10-23 ENCOUNTER — Other Ambulatory Visit: Payer: Self-pay

## 2022-10-24 ENCOUNTER — Other Ambulatory Visit: Payer: Self-pay

## 2022-10-30 ENCOUNTER — Encounter: Payer: Self-pay | Admitting: Nurse Practitioner

## 2022-10-30 ENCOUNTER — Ambulatory Visit (INDEPENDENT_AMBULATORY_CARE_PROVIDER_SITE_OTHER): Payer: Medicaid Other | Admitting: Nurse Practitioner

## 2022-10-30 VITALS — BP 134/86 | HR 86 | Temp 97.6°F | Ht 65.0 in | Wt 139.0 lb

## 2022-10-30 DIAGNOSIS — I10 Essential (primary) hypertension: Secondary | ICD-10-CM | POA: Diagnosis not present

## 2022-10-30 DIAGNOSIS — Z1231 Encounter for screening mammogram for malignant neoplasm of breast: Secondary | ICD-10-CM

## 2022-10-30 DIAGNOSIS — Z13220 Encounter for screening for lipoid disorders: Secondary | ICD-10-CM | POA: Diagnosis not present

## 2022-10-30 DIAGNOSIS — Z23 Encounter for immunization: Secondary | ICD-10-CM | POA: Diagnosis not present

## 2022-10-30 DIAGNOSIS — Z1211 Encounter for screening for malignant neoplasm of colon: Secondary | ICD-10-CM

## 2022-10-30 NOTE — Patient Instructions (Signed)
1. Primary hypertension  - CBC - Comprehensive metabolic panel - Lipid Panel  2. Lipid screening  - Lipid Panel  3. Colon cancer screening  - Ambulatory referral to Gastroenterology  4. Encounter for screening mammogram for malignant neoplasm of breast  - MM Digital Diagnostic Bilat  Follow up:  Follow up in 6 months

## 2022-10-30 NOTE — Progress Notes (Signed)
@Patient  ID: Tracy Fischer, female    DOB: 24-May-1966, 56 y.o.   MRN: [id number]  Chief Complaint  Patient presents with   Hypertension    Referring provider: Barbette Merino, NP   HPI  Tracy Fischer 56 y.o. female  has a past medical history of Anxiety, Asthma, Depression, Fibroid, Hypertension, Preterm labor, and Vaginal Pap smear, abnormal.    Hypertension: Patient here for follow-up of elevated blood pressure. She is not exercising and is not adherent to low salt diet.  Does not check B/P at home.  Cardiac symptoms none. Patient denies chest pain, dyspnea, fatigue, and near-syncope. Cardiovascular risk factors: hypertension and sedentary lifestyle. Use of agents associated with hypertension: none. History of target organ damage: none. Denies any other concerns today.    Denies any fatigue, chest pain, shortness of breath, HA or dizziness. Denies any blurred vision, numbness or tingling.      No Known Allergies  Immunization History  Administered Date(s) Administered   Influenza,inj,Quad PF,6+ Mos 10/30/2022   PFIZER(Purple Top)SARS-COV-2 Vaccination 02/07/2020, 02/28/2020, 10/26/2020    Past Medical History:  Diagnosis Date   Anxiety    Asthma    Depression    Fibroid    Hypertension    Preterm labor    Vaginal Pap smear, abnormal     Tobacco History: Social History   Tobacco Use  Smoking Status Every Day   Packs/day: 1.00   Years: 30.00   Total pack years: 30.00   Types: Cigarettes  Smokeless Tobacco Never  Tobacco Comments   1 Pack every 2 to 3 days   Ready to quit: Not Answered Counseling given: Not Answered Tobacco comments: 1 Pack every 2 to 3 days   Outpatient Encounter Medications as of 10/30/2022  Medication Sig   acetaminophen (TYLENOL) 500 MG tablet Take 2 tablets (1,000 mg total) by mouth every 8 (eight) hours as needed for mild pain.   amLODipine (NORVASC) 10 MG tablet Take 1 tablet (10 mg total) by mouth once daily.    carvedilol (COREG) 3.125 MG tablet Take 1 tablet (3.125 mg total) by mouth 2 (two) times daily with a meal.   mirtazapine (REMERON) 15 MG tablet Take 1 tablet (15 mg total) by mouth once at bedtime.   ranitidine (ZANTAC) 75 MG tablet Take 75 mg by mouth 2 (two) times daily as needed for heartburn.   No facility-administered encounter medications on file as of 10/30/2022.     Review of Systems  Review of Systems  Constitutional: Negative.   HENT: Negative.    Cardiovascular: Negative.   Gastrointestinal: Negative.   Allergic/Immunologic: Negative.   Neurological: Negative.   Psychiatric/Behavioral: Negative.         Physical Exam  BP 134/86   Pulse 86   Temp 97.6 F (36.4 C)   Ht 5\' 5"  (1.651 m)   Wt 139 lb (63 kg)   LMP 04/21/2015 (Approximate) Comment: Perimenopausal  SpO2 99%   BMI 23.13 kg/m   Wt Readings from Last 5 Encounters:  10/30/22 139 lb (63 kg)  05/02/22 134 lb 9.6 oz (61.1 kg)  01/25/22 128 lb (58.1 kg)  01/02/22 127 lb (57.6 kg)  12/08/21 126 lb (57.2 kg)     Physical Exam Vitals and nursing note reviewed.  Constitutional:      General: She is not in acute distress.    Appearance: She is well-developed.  Cardiovascular:     Rate and Rhythm: Normal rate and regular rhythm.  Pulmonary:  Effort: Pulmonary effort is normal.     Breath sounds: Normal breath sounds.  Neurological:     Mental Status: She is alert and oriented to person, place, and time.      Lab Results:  CBC    Component Value Date/Time   WBC 5.4 10/30/2022 1200   WBC 10.8 (H) 03/24/2021 0502   RBC 4.56 10/30/2022 1200   RBC 3.47 (L) 03/24/2021 0502   HGB 14.3 10/30/2022 1200   HCT 41.6 10/30/2022 1200   PLT 296 10/30/2022 1200   MCV 91 10/30/2022 1200   MCH 31.4 10/30/2022 1200   MCH 31.7 03/24/2021 0502   MCHC 34.4 10/30/2022 1200   MCHC 33.1 03/24/2021 0502   RDW 13.4 10/30/2022 1200   LYMPHSABS 2.0 12/08/2021 1131   MONOABS 1.1 (H) 03/16/2021 2040   EOSABS  0.1 12/08/2021 1131   BASOSABS 0.0 12/08/2021 1131    BMET    Component Value Date/Time   NA 139 10/30/2022 1200   K 4.5 10/30/2022 1200   CL 104 10/30/2022 1200   CO2 24 10/30/2022 1200   GLUCOSE 92 10/30/2022 1200   GLUCOSE 120 (H) 03/24/2021 0502   BUN 14 10/30/2022 1200   CREATININE 0.94 10/30/2022 1200   CALCIUM 9.7 10/30/2022 1200   GFRNONAA >60 03/24/2021 0502   GFRAA >60 06/13/2015 2224    BNP No results found for: "BNP"  ProBNP No results found for: "PROBNP"  Imaging: No results found.   Assessment & Plan:   Hypertensive disorder - CBC - Comprehensive metabolic panel - Lipid Panel  2. Lipid screening  - Lipid Panel  3. Colon cancer screening  - Ambulatory referral to Gastroenterology  4. Encounter for screening mammogram for malignant neoplasm of breast  - MM Digital Diagnostic Bilat  Follow up:  Follow up in 6 months      Ivonne Andrew, NP 11/01/2022

## 2022-10-31 LAB — COMPREHENSIVE METABOLIC PANEL
ALT: 20 IU/L (ref 0–32)
AST: 18 IU/L (ref 0–40)
Albumin/Globulin Ratio: 1.7 (ref 1.2–2.2)
Albumin: 4.3 g/dL (ref 3.8–4.9)
Alkaline Phosphatase: 84 IU/L (ref 44–121)
BUN/Creatinine Ratio: 15 (ref 9–23)
BUN: 14 mg/dL (ref 6–24)
Bilirubin Total: 0.5 mg/dL (ref 0.0–1.2)
CO2: 24 mmol/L (ref 20–29)
Calcium: 9.7 mg/dL (ref 8.7–10.2)
Chloride: 104 mmol/L (ref 96–106)
Creatinine, Ser: 0.94 mg/dL (ref 0.57–1.00)
Globulin, Total: 2.5 g/dL (ref 1.5–4.5)
Glucose: 92 mg/dL (ref 70–99)
Potassium: 4.5 mmol/L (ref 3.5–5.2)
Sodium: 139 mmol/L (ref 134–144)
Total Protein: 6.8 g/dL (ref 6.0–8.5)
eGFR: 71 mL/min/{1.73_m2} (ref >59)

## 2022-10-31 LAB — CBC
Hematocrit: 41.6 % (ref 34.0–46.6)
Hemoglobin: 14.3 g/dL (ref 11.1–15.9)
MCH: 31.4 pg (ref 26.6–33.0)
MCHC: 34.4 g/dL (ref 31.5–35.7)
MCV: 91 fL (ref 79–97)
Platelets: 296 10*3/uL (ref 150–450)
RBC: 4.56 x10E6/uL (ref 3.77–5.28)
RDW: 13.4 % (ref 11.7–15.4)
WBC: 5.4 10*3/uL (ref 3.4–10.8)

## 2022-10-31 LAB — LIPID PANEL
Chol/HDL Ratio: 4.6 ratio — ABNORMAL HIGH (ref 0.0–4.4)
Cholesterol, Total: 257 mg/dL — ABNORMAL HIGH (ref 100–199)
HDL: 56 mg/dL (ref >39)
LDL Chol Calc (NIH): 149 mg/dL — ABNORMAL HIGH (ref 0–99)
Triglycerides: 283 mg/dL — ABNORMAL HIGH (ref 0–149)
VLDL Cholesterol Cal: 52 mg/dL — ABNORMAL HIGH (ref 5–40)

## 2022-11-01 ENCOUNTER — Other Ambulatory Visit: Payer: Self-pay | Admitting: Nurse Practitioner

## 2022-11-01 DIAGNOSIS — E782 Mixed hyperlipidemia: Secondary | ICD-10-CM

## 2022-11-01 MED ORDER — SIMVASTATIN 20 MG PO TABS: 20.0000 mg | ORAL_TABLET | Freq: Every evening | ORAL | 11 refills | Status: DC

## 2022-11-01 NOTE — Assessment & Plan Note (Signed)
-   CBC - Comprehensive metabolic panel - Lipid Panel  2. Lipid screening  - Lipid Panel  3. Colon cancer screening  - Ambulatory referral to Gastroenterology  4. Encounter for screening mammogram for malignant neoplasm of breast  - MM Digital Diagnostic Bilat  Follow up:  Follow up in 6 months

## 2022-11-06 ENCOUNTER — Other Ambulatory Visit: Payer: Self-pay | Admitting: *Deleted

## 2022-11-06 ENCOUNTER — Other Ambulatory Visit: Payer: Self-pay

## 2022-11-06 DIAGNOSIS — E782 Mixed hyperlipidemia: Secondary | ICD-10-CM

## 2022-11-06 MED ORDER — SIMVASTATIN 20 MG PO TABS
20.0000 mg | ORAL_TABLET | Freq: Every evening | ORAL | 11 refills | Status: DC
  Filled 2022-11-06: qty 30, 30d supply, fill #0
  Filled 2022-12-11: qty 30, 30d supply, fill #1
  Filled 2023-02-14: qty 30, 30d supply, fill #2
  Filled 2023-03-29: qty 30, 30d supply, fill #3
  Filled 2023-05-07: qty 30, 30d supply, fill #4
  Filled 2023-06-29: qty 30, 30d supply, fill #5
  Filled 2023-08-15: qty 30, 30d supply, fill #6
  Filled 2023-11-05: qty 30, 30d supply, fill #7

## 2022-11-08 ENCOUNTER — Other Ambulatory Visit: Payer: Self-pay

## 2022-12-11 ENCOUNTER — Other Ambulatory Visit: Payer: Self-pay

## 2023-01-02 ENCOUNTER — Ambulatory Visit
Admission: RE | Admit: 2023-01-02 | Discharge: 2023-01-02 | Disposition: A | Discharge status: Home / Self Care | Payer: Medicaid Other | Source: Ambulatory Visit | Attending: Obstetrics and Gynecology | Admitting: Obstetrics and Gynecology

## 2023-01-02 DIAGNOSIS — Z1231 Encounter for screening mammogram for malignant neoplasm of breast: Secondary | ICD-10-CM

## 2023-01-05 ENCOUNTER — Other Ambulatory Visit: Payer: Self-pay | Admitting: Obstetrics and Gynecology

## 2023-01-05 DIAGNOSIS — R928 Other abnormal and inconclusive findings on diagnostic imaging of breast: Secondary | ICD-10-CM

## 2023-01-16 ENCOUNTER — Other Ambulatory Visit: Payer: Self-pay

## 2023-01-19 ENCOUNTER — Ambulatory Visit
Admission: RE | Admit: 2023-01-19 | Discharge: 2023-01-19 | Disposition: A | Discharge status: Home / Self Care | Payer: Medicaid Other | Source: Ambulatory Visit | Attending: Obstetrics and Gynecology | Admitting: Obstetrics and Gynecology

## 2023-01-19 DIAGNOSIS — N6323 Unspecified lump in the left breast, lower outer quadrant: Secondary | ICD-10-CM | POA: Diagnosis not present

## 2023-01-19 DIAGNOSIS — R928 Other abnormal and inconclusive findings on diagnostic imaging of breast: Secondary | ICD-10-CM

## 2023-01-19 DIAGNOSIS — N6312 Unspecified lump in the right breast, upper inner quadrant: Secondary | ICD-10-CM | POA: Diagnosis not present

## 2023-01-19 DIAGNOSIS — N6321 Unspecified lump in the left breast, upper outer quadrant: Secondary | ICD-10-CM | POA: Diagnosis not present

## 2023-01-19 DIAGNOSIS — N6311 Unspecified lump in the right breast, upper outer quadrant: Secondary | ICD-10-CM | POA: Diagnosis not present

## 2023-01-22 ENCOUNTER — Other Ambulatory Visit: Payer: Self-pay

## 2023-02-05 ENCOUNTER — Other Ambulatory Visit: Payer: Self-pay | Admitting: Obstetrics and Gynecology

## 2023-02-05 DIAGNOSIS — N6310 Unspecified lump in the right breast, unspecified quadrant: Secondary | ICD-10-CM

## 2023-03-02 ENCOUNTER — Other Ambulatory Visit: Payer: Self-pay

## 2023-03-02 DIAGNOSIS — J309 Allergic rhinitis, unspecified: Secondary | ICD-10-CM | POA: Diagnosis not present

## 2023-03-02 DIAGNOSIS — L720 Epidermal cyst: Secondary | ICD-10-CM | POA: Diagnosis not present

## 2023-03-02 DIAGNOSIS — I10 Essential (primary) hypertension: Secondary | ICD-10-CM | POA: Diagnosis not present

## 2023-03-02 DIAGNOSIS — Z1211 Encounter for screening for malignant neoplasm of colon: Secondary | ICD-10-CM | POA: Diagnosis not present

## 2023-03-02 DIAGNOSIS — E785 Hyperlipidemia, unspecified: Secondary | ICD-10-CM | POA: Diagnosis not present

## 2023-03-02 MED ORDER — FLUTICASONE PROPIONATE 50 MCG/ACT NA SUSP
1.0000 | Freq: Every day | NASAL | 3 refills | Status: DC
  Filled 2023-03-02: qty 16, 30d supply, fill #0

## 2023-03-05 ENCOUNTER — Other Ambulatory Visit: Payer: Self-pay

## 2023-03-05 MED ORDER — CYCLOSPORINE 0.05 % OP EMUL
1.0000 [drp] | Freq: Two times a day (BID) | OPHTHALMIC | 3 refills | Status: AC
  Filled 2023-03-05 – 2023-03-22 (×2): qty 60, 30d supply, fill #0
  Filled 2024-02-25: qty 60, 30d supply, fill #1

## 2023-03-05 MED ORDER — EYSUVIS 0.25 % OP SUSP
1.0000 [drp] | Freq: Four times a day (QID) | OPHTHALMIC | 3 refills | Status: AC
  Filled 2023-03-05 – 2023-03-22 (×2): qty 8.3, 21d supply, fill #0
  Filled 2023-03-22: qty 8.3, 14d supply, fill #0
  Filled 2023-03-27: qty 8.3, 21d supply, fill #0

## 2023-03-06 ENCOUNTER — Other Ambulatory Visit: Payer: Self-pay

## 2023-03-07 ENCOUNTER — Other Ambulatory Visit: Payer: Self-pay

## 2023-03-12 DIAGNOSIS — H5213 Myopia, bilateral: Secondary | ICD-10-CM | POA: Diagnosis not present

## 2023-03-13 ENCOUNTER — Other Ambulatory Visit: Payer: Self-pay

## 2023-03-22 ENCOUNTER — Other Ambulatory Visit: Payer: Self-pay

## 2023-03-23 ENCOUNTER — Other Ambulatory Visit: Payer: Self-pay

## 2023-03-27 ENCOUNTER — Other Ambulatory Visit: Payer: Self-pay

## 2023-03-30 ENCOUNTER — Other Ambulatory Visit: Payer: Self-pay

## 2023-04-26 ENCOUNTER — Other Ambulatory Visit: Payer: Self-pay

## 2023-05-02 ENCOUNTER — Ambulatory Visit: Payer: Self-pay | Admitting: Nurse Practitioner

## 2023-05-07 ENCOUNTER — Other Ambulatory Visit: Payer: Self-pay

## 2023-05-07 MED ORDER — AMOXICILLIN 500 MG PO CAPS
500.0000 mg | ORAL_CAPSULE | Freq: Three times a day (TID) | ORAL | 0 refills | Status: DC
  Filled 2023-05-07: qty 21, 7d supply, fill #0

## 2023-06-14 ENCOUNTER — Other Ambulatory Visit: Payer: Self-pay

## 2023-06-14 MED ORDER — CHLORHEXIDINE GLUCONATE 0.12 % MT SOLN
OROMUCOSAL | 0 refills | Status: DC
  Filled 2023-06-14: qty 473, 16d supply, fill #0

## 2023-06-22 ENCOUNTER — Other Ambulatory Visit: Payer: Self-pay

## 2023-06-28 ENCOUNTER — Other Ambulatory Visit: Payer: Self-pay

## 2023-06-29 ENCOUNTER — Ambulatory Visit (HOSPITAL_COMMUNITY)
Admission: EM | Admit: 2023-06-29 | Discharge: 2023-06-29 | Disposition: A | Discharge status: Home / Self Care | Payer: Medicaid Other | Source: Home / Self Care

## 2023-06-29 ENCOUNTER — Encounter (HOSPITAL_COMMUNITY): Payer: Self-pay | Admitting: Emergency Medicine

## 2023-06-29 ENCOUNTER — Other Ambulatory Visit: Payer: Self-pay

## 2023-06-29 DIAGNOSIS — Z760 Encounter for issue of repeat prescription: Secondary | ICD-10-CM | POA: Diagnosis not present

## 2023-06-29 DIAGNOSIS — I10 Essential (primary) hypertension: Secondary | ICD-10-CM | POA: Diagnosis not present

## 2023-06-29 MED ORDER — CARVEDILOL 3.125 MG PO TABS
3.1250 mg | ORAL_TABLET | Freq: Two times a day (BID) | ORAL | 0 refills | Status: DC
  Filled 2023-06-29: qty 20, 10d supply, fill #0

## 2023-06-29 MED ORDER — AMLODIPINE BESYLATE 10 MG PO TABS
10.0000 mg | ORAL_TABLET | Freq: Every day | ORAL | 0 refills | Status: DC
  Filled 2023-06-29: qty 10, 10d supply, fill #0

## 2023-06-29 NOTE — ED Provider Notes (Signed)
[facility]    CSN: [id number] Arrival date & time: 06/29/23  1112      History   Chief Complaint Chief Complaint  Patient presents with   Refill on BP med    HPI Tracy Fischer is a 57 y.o. female.  Presents for medication refill History of hypertension  Prescribed amlodipine 10 mg daily and carvedilol 3.125 mg twice daily. Ran out about 2 weeks ago but has been taking her husbands amlodipine  She does have a PCP and reports next visit is August 19th (in 10 days)  BP today 135/82  Past Medical History:  Diagnosis Date   Anxiety    Asthma    Depression    Fibroid    Hypertension    Preterm labor    Vaginal Pap smear, abnormal     Patient Active Problem List   Diagnosis Date Noted   Adnexal mass 01/02/2022   Allergic rhinitis 09/05/2021   Hypertensive disorder 09/05/2021    Past Surgical History:  Procedure Laterality Date   CESAREAN SECTION     CESAREAN SECTION     LAPAROSCOPIC APPENDECTOMY N/A 03/17/2021   Procedure: APPENDECTOMY LAPAROSCOPIC;  Surgeon: Quentin Ore, MD;  Location: [facility];  Service: General;  Laterality: N/A;    OB History     Gravida  6   Para  2   Term      Preterm  2   AB      Living  2      SAB      IAB      Ectopic      Multiple      Live Births               Home Medications    Prior to Admission medications   Medication Sig Start Date End Date Taking? Authorizing Provider  acetaminophen (TYLENOL) 500 MG tablet Take 2 tablets (1,000 mg total) by mouth every 8 (eight) hours as needed for mild pain. 03/24/21  Yes Maczis, Elmer Sow, PA-C  chlorhexidine (PERIDEX) 0.12 % solution Rinse with 15 mL by mouth for 30 seconds two times daily in the morning and the evening. 06/14/23  Yes   cycloSPORINE (RESTASIS) 0.05 % ophthalmic emulsion Place 1 drop into both eyes 2 (two) times daily. 03/03/23  Yes   fluticasone (FLONASE ALLERGY RELIEF) 50 MCG/ACT nasal spray Place 1 spray into both nostrils  daily. 03/02/23  Yes   Loteprednol Etabonate (EYSUVIS) 0.25 % SUSP Place 1 drop into both eyes 4 (four) times daily for up to 2 weeks. 03/03/23  Yes   mirtazapine (REMERON) 15 MG tablet Take 1 tablet (15 mg total) by mouth once at bedtime. 05/02/22  Yes Passmore, Enid Derry I, NP  ranitidine (ZANTAC) 75 MG tablet Take 75 mg by mouth 2 (two) times daily as needed for heartburn.   Yes [provider]  simvastatin (ZOCOR) 20 MG tablet Take 1 tablet (20 mg total) by mouth every evening. 11/06/22 11/06/23 Yes Ivonne Andrew, NP  amLODipine (NORVASC) 10 MG tablet Take 1 tablet (10 mg total) by mouth daily for 10 days. 06/29/23 07/09/23  , Lurena Joiner, PA-C  carvedilol (COREG) 3.125 MG tablet Take 1 tablet (3.125 mg total) by mouth 2 (two) times daily with a meal for 10 days. 06/29/23 07/09/23  , Lurena Joiner PA-C    Family History Family History  Problem Relation Age of Onset   Diabetes Mellitus II Father    Hypertension Father  Cancer Maternal Aunt     Social History Social History   Tobacco Use   Smoking status: Every Day    Current packs/day: 1.00    Average packs/day: 1 pack/day for 30.0 years (30.0 ttl pk-yrs)    Types: Cigarettes   Smokeless tobacco: Never   Tobacco comments:    1 Pack every 2 to 3 days  Vaping Use   Vaping status: Never Used  Substance Use Topics   Alcohol use: Yes    Comment: weekly wine or liquor   Drug use: Yes    Types: Marijuana    Comment: helps with nausea (increased over the last few months     Allergies   Patient has no known allergies.   Review of Systems Review of Systems As per HPI  Physical Exam Triage Vital Signs ED Triage Vitals  Encounter Vitals Group     BP      Systolic BP Percentile      Diastolic BP Percentile      Pulse      Resp      Temp      Temp src      SpO2      Weight      Height      Head Circumference      Peak Flow      Pain Score      Pain Loc      Pain Education      Exclude from Growth Chart     No data found.  Updated Vital Signs BP 135/82 (BP Location: Left Arm)   Pulse 84   Temp 97.9 F (36.6 C) (Oral)   Resp 16   Ht 5\' 5"  (1.651 m)   Wt 130 lb (59 kg)   LMP 04/21/2015 (Approximate) Comment: Perimenopausal  SpO2 97%   BMI 21.63 kg/m   Physical Exam Vitals and nursing note reviewed.  Constitutional:      Appearance: Normal appearance.  Cardiovascular:     Rate and Rhythm: Normal rate and regular rhythm.     Pulses: Normal pulses.     Heart sounds: Normal heart sounds.  Pulmonary:     Effort: Pulmonary effort is normal.     Breath sounds: Normal breath sounds.  Musculoskeletal:     Cervical back: Normal range of motion.  Skin:    General: Skin is warm and dry.  Neurological:     Mental Status: She is alert and oriented to person, place, and time.     UC Treatments / Results  Labs (all labs ordered are listed, but only abnormal results are displayed) Labs Reviewed - No data to display  EKG  Radiology No results found.  Procedures Procedures  Medications Ordered in UC Medications - No data to display  Initial Impression / Assessment and Plan / UC Course  I have reviewed the triage vital signs and the nursing notes.  Pertinent labs & imaging results that were available during my care of the patient were reviewed by me and considered in my medical decision making (see chart for details).  Refilled amlodipine and carvedilol, 10 day supply to last until her PCP visit. No concerns or questions at this time  Final Clinical Impressions(s) / UC Diagnoses   Final diagnoses:  Primary hypertension  Medication refill     Discharge Instructions      I have refilled your medications to last until your primary care visit on the 19th. You will need to keep this  appointment for further refills!   Please return with any concerns      ED Prescriptions     Medication Sig Dispense Auth. Provider   amLODipine (NORVASC) 10 MG tablet Take 1 tablet  (10 mg total) by mouth daily for 10 days. 10 tablet , , PA-C   carvedilol (COREG) 3.125 MG tablet Take 1 tablet (3.125 mg total) by mouth 2 (two) times daily with a meal for 10 days. 20 tablet , Lurena Joiner, PA-C      PDMP not reviewed this encounter.   Kathrine Haddock 06/29/23 1251

## 2023-06-29 NOTE — ED Triage Notes (Signed)
Patient requesting a refill on BP med Coreg 3.125mg  and Norvasc 10mg .  Patient does have an appt w/PCP on 07/09/2023.

## 2023-06-29 NOTE — Discharge Instructions (Addendum)
I have refilled your medications to last until your primary care visit on the 19th. You will need to keep this appointment for further refills!   Please return with any concerns

## 2023-07-05 ENCOUNTER — Other Ambulatory Visit: Payer: Self-pay

## 2023-07-09 ENCOUNTER — Other Ambulatory Visit: Payer: Self-pay

## 2023-07-09 MED ORDER — CARVEDILOL 3.125 MG PO TABS
3.1250 mg | ORAL_TABLET | Freq: Two times a day (BID) | ORAL | 3 refills | Status: DC
  Filled 2023-07-09: qty 60, 30d supply, fill #0
  Filled 2023-08-15: qty 60, 30d supply, fill #1
  Filled 2023-10-11: qty 60, 30d supply, fill #2
  Filled 2023-11-05: qty 60, 30d supply, fill #3

## 2023-07-09 MED ORDER — AMLODIPINE BESYLATE 10 MG PO TABS
10.0000 mg | ORAL_TABLET | Freq: Every day | ORAL | 3 refills | Status: DC
  Filled 2023-07-09: qty 30, 30d supply, fill #0
  Filled 2023-08-15: qty 30, 30d supply, fill #1
  Filled 2023-10-11: qty 30, 30d supply, fill #2
  Filled 2023-11-05: qty 30, 30d supply, fill #3

## 2023-07-24 ENCOUNTER — Other Ambulatory Visit: Payer: Medicaid Other

## 2023-07-24 ENCOUNTER — Other Ambulatory Visit: Payer: Self-pay | Admitting: Obstetrics and Gynecology

## 2023-07-24 ENCOUNTER — Ambulatory Visit
Admission: RE | Admit: 2023-07-24 | Discharge: 2023-07-24 | Disposition: A | Discharge status: Home / Self Care | Payer: Medicaid Other | Source: Ambulatory Visit | Attending: Obstetrics and Gynecology | Admitting: Obstetrics and Gynecology

## 2023-07-24 DIAGNOSIS — N6310 Unspecified lump in the right breast, unspecified quadrant: Secondary | ICD-10-CM

## 2023-07-24 DIAGNOSIS — N6315 Unspecified lump in the right breast, overlapping quadrants: Secondary | ICD-10-CM | POA: Diagnosis not present

## 2023-08-01 ENCOUNTER — Ambulatory Visit
Admission: RE | Admit: 2023-08-01 | Discharge: 2023-08-01 | Disposition: A | Discharge status: Home / Self Care | Payer: Medicaid Other | Source: Ambulatory Visit | Attending: Obstetrics and Gynecology | Admitting: Obstetrics and Gynecology

## 2023-08-01 DIAGNOSIS — N6310 Unspecified lump in the right breast, unspecified quadrant: Secondary | ICD-10-CM

## 2023-08-01 DIAGNOSIS — N6312 Unspecified lump in the right breast, upper inner quadrant: Secondary | ICD-10-CM | POA: Diagnosis not present

## 2023-08-01 DIAGNOSIS — N6031 Fibrosclerosis of right breast: Secondary | ICD-10-CM | POA: Diagnosis not present

## 2023-08-01 HISTORY — PX: BREAST BIOPSY: SHX20

## 2023-08-02 LAB — SURGICAL PATHOLOGY

## 2023-08-16 ENCOUNTER — Encounter: Payer: Self-pay | Admitting: Family Medicine

## 2023-08-16 ENCOUNTER — Ambulatory Visit: Payer: Medicaid Other | Admitting: Family Medicine

## 2023-08-16 VITALS — BP 128/82 | HR 78 | Temp 98.1°F | Ht 66.0 in | Wt 144.2 lb

## 2023-08-16 DIAGNOSIS — N9489 Other specified conditions associated with female genital organs and menstrual cycle: Secondary | ICD-10-CM | POA: Diagnosis not present

## 2023-08-16 DIAGNOSIS — Z1211 Encounter for screening for malignant neoplasm of colon: Secondary | ICD-10-CM

## 2023-08-16 DIAGNOSIS — L989 Disorder of the skin and subcutaneous tissue, unspecified: Secondary | ICD-10-CM | POA: Diagnosis not present

## 2023-08-16 DIAGNOSIS — F17210 Nicotine dependence, cigarettes, uncomplicated: Secondary | ICD-10-CM

## 2023-08-16 DIAGNOSIS — F17200 Nicotine dependence, unspecified, uncomplicated: Secondary | ICD-10-CM | POA: Insufficient documentation

## 2023-08-16 DIAGNOSIS — I10 Essential (primary) hypertension: Secondary | ICD-10-CM | POA: Diagnosis not present

## 2023-08-16 DIAGNOSIS — E782 Mixed hyperlipidemia: Secondary | ICD-10-CM | POA: Diagnosis not present

## 2023-08-16 NOTE — Progress Notes (Signed)
New Patient Office Visit  Subjective    Patient ID: Tracy Fischer, female    DOB: 1966/07/30  Age: 57 y.o. MRN: [id number]  CC:  Chief Complaint  Patient presents with   Establish Care    HPI Tracy Fischer presents to establish care Patient reports that she needs a colonoscopy. States that she had an appendix surgery, states she was told that she had a "mass" there that needed evaluated. This was 2-3 years ago. States she did follow up with the GYN however because she didn't have insurance she did not continue the work up. I reviewed the GYN's note and he mentioned that it could be a "left uterus." States she stopped having periods about 6 years ago, has not had any bleeding since then, occasionally will have pelvic pain, sometimes sitting ot laying down, depending on what position she is in. Still urinating and having regular BM's, no issues there.   Patient has a history of HTN and HLD, currently on amlodipine and carvedilol forh er BP. Simvastatin for her HLD. Reports she is tolerating these medications fine, no side effects reported. Also has a [facility] provider who is prescribing mirtazapine at night.   I have reviewed all aspects of the patient's medical history including social, family, and surgical history.    Current Outpatient Medications  Medication Instructions   acetaminophen (TYLENOL) 1,000 mg, Oral, Every 8 hours PRN   amLODipine (NORVASC) 10 mg, Oral, Daily   carvedilol (COREG) 3.125 mg, Oral, 2 times daily   cycloSPORINE (RESTASIS) 0.05 % ophthalmic emulsion 1 drop, Both Eyes, 2 times daily   fluticasone (FLONASE ALLERGY RELIEF) 50 MCG/ACT nasal spray 1 spray, Each Nare, Daily   Loteprednol Etabonate (EYSUVIS) 0.25 % SUSP Place 1 drop into both eyes 4 (four) times daily for up to 2 weeks.   mirtazapine (REMERON) 15 MG tablet Take 1 tablet (15 mg total) by mouth once at bedtime.   OVER THE COUNTER MEDICATION Acid reducer   OVER THE COUNTER MEDICATION Allergy  relief   simvastatin (ZOCOR) 20 mg, Oral, Every evening    Past Medical History:  Diagnosis Date   Allergies    Anxiety    Asthma    Depression    Drug addiction in remission (HCC)    Fibroid    GERD (gastroesophageal reflux disease)    History of chickenpox    Hyperlipidemia    Hypertension    Migraines    Preterm labor    Vaginal Pap smear, abnormal     Past Surgical History:  Procedure Laterality Date   BREAST BIOPSY Right 08/01/2023   [place] RT BREAST BX W LOC DEV 1ST LESION IMG BX SPEC US GUIDE 08/01/2023 [facility]   CESAREAN SECTION     CESAREAN SECTION     CESAREAN SECTION     LAPAROSCOPIC APPENDECTOMY N/A 03/17/2021   Procedure: APPENDECTOMY LAPAROSCOPIC;  Surgeon: Quentin Ore, MD;  Location: [facility];  Service: General;  Laterality: N/A;    Family History  Problem Relation Age of Onset   Hyperlipidemia Mother    Depression Mother    Asthma Father    Hyperlipidemia Father    Diabetes Mellitus II Father    Hypertension Father    Cancer Maternal Aunt     Social History   Socioeconomic History   Marital status: Single    Spouse name: Not on file   Number of children: 2   Years of education: Not on file   Highest  education level: Not on file  Occupational History   Not on file  Tobacco Use   Smoking status: Every Day    Average packs/day: 0.8 packs/day for 80.0 years (60.0 ttl pk-yrs)    Types: Cigarettes    Start date: 98   Smokeless tobacco: Never   Tobacco comments:    1 Pack every 2 to 3 days  Vaping Use   Vaping status: Never Used  Substance and Sexual Activity   Alcohol use: Yes    Comment: weekly wine or liquor   Drug use: Yes    Types: Marijuana    Comment: helps with nausea (increased over the last few months   Sexual activity: Not Currently    Partners: Male    Birth control/protection: Post-menopausal  Other Topics Concern   Not on file  Social History Narrative   Not on file   Social Determinants of Health    Financial Resource Strain: Not on file  Food Insecurity: Not on file  Transportation Needs: Not on file  Physical Activity: Not on file  Stress: Not on file  Social Connections: Not on file  Intimate Partner Violence: Not on file    Review of Systems  All other systems reviewed and are negative.       Objective    BP 128/82 (BP Location: Right Arm, Patient Position: Sitting, Cuff Size: Normal)   Pulse 78   Temp 98.1 F (36.7 C) (Oral)   Ht 5\' 6"  (1.676 m)   Wt 144 lb 3.2 oz (65.4 kg)   LMP 04/21/2015 (Approximate) Comment: Perimenopausal  SpO2 97%   BMI 23.27 kg/m   Physical Exam Vitals reviewed.  Constitutional:      Appearance: Normal appearance. She is well-groomed and normal weight.  Eyes:     Conjunctiva/sclera: Conjunctivae normal.  Neck:     Thyroid: No thyromegaly.  Cardiovascular:     Rate and Rhythm: Normal rate and regular rhythm.     Pulses: Normal pulses.     Heart sounds: S1 normal and S2 normal.  Pulmonary:     Effort: Pulmonary effort is normal.     Breath sounds: Normal breath sounds and air entry.  Abdominal:     General: Bowel sounds are normal.  Musculoskeletal:     Right lower leg: No edema.     Left lower leg: No edema.  Neurological:     Mental Status: She is alert and oriented to person, place, and time. Mental status is at baseline.     Gait: Gait is intact.  Psychiatric:        Mood and Affect: Mood and affect normal.        Speech: Speech normal.        Behavior: Behavior normal.        Judgment: Judgment normal.         Assessment & Plan:  Adnexal mass Assessment & Plan: Has seen GYN in the past but did not have insurance to complete her work up. Will send back to GYN for further evaluation,. Pt is currently asymptomatic.   Orders: -     Ambulatory referral to Gynecology  Colon cancer screening -     Ambulatory referral to Gastroenterology  Skin lesion of cheek -     Ambulatory referral to  Dermatology  Primary hypertension Assessment & Plan: Current hypertension medications:       Sig   amLODipine (NORVASC) 10 MG tablet (Taking) Take 1 tablet (10 mg total) by mouth  daily.   carvedilol (COREG) 3.125 MG tablet (Taking) Take 1 tablet (3.125 mg total) by mouth 2 (two) times daily.      BP is chronic, stable, will continue these medications as prescribed  Orders: -     Comprehensive metabolic panel; Future  Mixed hyperlipidemia Assessment & Plan: On simvastatin 20 mg daily, needs new lipid panel today  Orders: -     Lipid panel; Future  Tobacco dependence syndrome Assessment & Plan: We discussed smoking cessation for about 5 minutes today. Gave pt options for treatment including chantix vs nicotine replacement. Pt states she is not yet ready to quit. Tobacco history updated. We discussed that she has a 20 pack year history, not enough yet for LDCT to be done.      Return in about 6 months (around 02/13/2024) for HTN, annual physical exam.   Karie Georges, MD

## 2023-08-20 ENCOUNTER — Other Ambulatory Visit: Payer: Self-pay | Admitting: Physician Assistant

## 2023-08-20 ENCOUNTER — Other Ambulatory Visit: Payer: Self-pay

## 2023-08-20 ENCOUNTER — Other Ambulatory Visit: Payer: Medicaid Other

## 2023-08-20 DIAGNOSIS — L989 Disorder of the skin and subcutaneous tissue, unspecified: Secondary | ICD-10-CM | POA: Insufficient documentation

## 2023-08-20 DIAGNOSIS — R921 Mammographic calcification found on diagnostic imaging of breast: Secondary | ICD-10-CM

## 2023-08-20 DIAGNOSIS — E782 Mixed hyperlipidemia: Secondary | ICD-10-CM | POA: Insufficient documentation

## 2023-08-20 NOTE — Assessment & Plan Note (Signed)
We discussed smoking cessation for about 5 minutes today. Gave pt options for treatment including chantix vs nicotine replacement. Pt states she is not yet ready to quit. Tobacco history updated. We discussed that she has a 20 pack year history, not enough yet for LDCT to be done.

## 2023-08-20 NOTE — Assessment & Plan Note (Signed)
On simvastatin 20 mg daily, needs new lipid panel today

## 2023-08-20 NOTE — Assessment & Plan Note (Signed)
Has seen GYN in the past but did not have insurance to complete her work up. Will send back to GYN for further evaluation,. Pt is currently asymptomatic.

## 2023-08-20 NOTE — Assessment & Plan Note (Signed)
Current hypertension medications:       Sig   amLODipine (NORVASC) 10 MG tablet (Taking) Take 1 tablet (10 mg total) by mouth daily.   carvedilol (COREG) 3.125 MG tablet (Taking) Take 1 tablet (3.125 mg total) by mouth 2 (two) times daily.      BP is chronic, stable, will continue these medications as prescribed

## 2023-08-23 ENCOUNTER — Other Ambulatory Visit (INDEPENDENT_AMBULATORY_CARE_PROVIDER_SITE_OTHER): Payer: Medicaid Other

## 2023-08-23 DIAGNOSIS — I10 Essential (primary) hypertension: Secondary | ICD-10-CM | POA: Diagnosis not present

## 2023-08-23 DIAGNOSIS — E782 Mixed hyperlipidemia: Secondary | ICD-10-CM | POA: Diagnosis not present

## 2023-08-23 LAB — COMPREHENSIVE METABOLIC PANEL
ALT: 20 U/L (ref 0–35)
AST: 17 U/L (ref 0–37)
Albumin: 4 g/dL (ref 3.5–5.2)
Alkaline Phosphatase: 71 U/L (ref 39–117)
BUN: 13 mg/dL (ref 6–23)
CO2: 24 meq/L (ref 19–32)
Calcium: 9 mg/dL (ref 8.4–10.5)
Chloride: 108 meq/L (ref 96–112)
Creatinine, Ser: 0.78 mg/dL (ref 0.40–1.20)
GFR: 84.33 mL/min (ref >60.00)
Glucose, Bld: 83 mg/dL (ref 70–99)
Potassium: 3.9 meq/L (ref 3.5–5.1)
Sodium: 138 meq/L (ref 135–145)
Total Bilirubin: 0.4 mg/dL (ref 0.2–1.2)
Total Protein: 6.8 g/dL (ref 6.0–8.3)

## 2023-08-23 LAB — LIPID PANEL
Cholesterol: 210 mg/dL — ABNORMAL HIGH (ref 0–200)
HDL: 57.8 mg/dL (ref >39.00)
LDL Cholesterol: 101 mg/dL — ABNORMAL HIGH (ref 0–99)
NonHDL: 151.73
Total CHOL/HDL Ratio: 4
Triglycerides: 255 mg/dL — ABNORMAL HIGH (ref 0.0–149.0)
VLDL: 51 mg/dL — ABNORMAL HIGH (ref 0.0–40.0)

## 2023-08-31 ENCOUNTER — Ambulatory Visit: Payer: Medicaid Other | Admitting: Obstetrics and Gynecology

## 2023-08-31 ENCOUNTER — Encounter: Payer: Self-pay | Admitting: Obstetrics and Gynecology

## 2023-08-31 ENCOUNTER — Other Ambulatory Visit (HOSPITAL_COMMUNITY)
Admission: RE | Admit: 2023-08-31 | Discharge: 2023-08-31 | Disposition: A | Discharge status: Home / Self Care | Payer: Medicaid Other | Source: Ambulatory Visit | Attending: Obstetrics and Gynecology | Admitting: Obstetrics and Gynecology

## 2023-08-31 VITALS — BP 134/84 | HR 83 | Ht 65.0 in | Wt 144.0 lb

## 2023-08-31 DIAGNOSIS — N951 Menopausal and female climacteric states: Secondary | ICD-10-CM | POA: Diagnosis not present

## 2023-08-31 DIAGNOSIS — Z124 Encounter for screening for malignant neoplasm of cervix: Secondary | ICD-10-CM

## 2023-08-31 DIAGNOSIS — N9489 Other specified conditions associated with female genital organs and menstrual cycle: Secondary | ICD-10-CM

## 2023-08-31 DIAGNOSIS — Q519 Congenital malformation of uterus and cervix, unspecified: Secondary | ICD-10-CM

## 2023-08-31 NOTE — Progress Notes (Signed)
57 y.o. 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  Patient's last menstrual period was 04/21/2015 (approximate).  Patient reports left sided adnexal mass causing intermittent pain. Notes soreness/sharpness of LLQ. Currently has a dull pain, rated 1/10. It sometimes feels like pressure "like uterus is falling" Worse with walking and sitting, at its worse rated: 7-8/10. Last felt a couple of months ago, also has to sleep with pillow in between legs.  Adnexal mass was first noted in 2022 during complicated admission for appendicitis. She has not had f/u since due to insurance issues.  No PMB. No vaginal itching, irritation. +VMS x40yrs  Last mammogram: s/p benign biopsy 2024, screening MMG due 2025 Last colonoscopy: ordered by PCP Sexually active: no    GYN HISTORY: Uterine anomaly Hx of CD x2  OB History  Gravida Para Term Preterm AB Living  6 2   2   2   SAB IAB Ectopic Multiple Live Births               # Outcome Date GA Lbr Len/2nd Weight Sex Type Anes PTL Lv  6 Gravida           5 Gravida           4 Gravida           3 Gravida           2 Preterm           1 Preterm             Past Medical History:  Diagnosis Date   Allergies    Anxiety    Asthma    Depression    Drug addiction in remission (HCC)    Fibroid    GERD (gastroesophageal reflux disease)    History of chickenpox    Hyperlipidemia    Hypertension    Migraines    Preterm labor    Vaginal Pap smear, abnormal     Past Surgical History:  Procedure Laterality Date   BREAST BIOPSY Right 08/01/2023   [place] RT BREAST BX W LOC DEV 1ST LESION IMG BX SPEC US GUIDE 08/01/2023 [facility]   CESAREAN SECTION     CESAREAN SECTION     CESAREAN SECTION     LAPAROSCOPIC APPENDECTOMY N/A 03/17/2021   Procedure: APPENDECTOMY LAPAROSCOPIC;  Surgeon: Quentin Ore, MD;  Location: [facility];  Service: General;  Laterality: N/A;    Current Outpatient Medications on File Prior to Visit  Medication Sig Dispense Refill   acetaminophen (TYLENOL) 500 MG tablet Take 2 tablets (1,000 mg total) by mouth every 8 (eight) hours as needed for mild pain. 30 tablet 0   amLODipine (NORVASC) 10 MG tablet Take 1 tablet (10 mg total) by mouth daily. 30 tablet 3   carvedilol (COREG) 3.125 MG tablet Take 1 tablet (3.125 mg total) by mouth 2 (two) times daily. 60 tablet 3   cycloSPORINE (RESTASIS) 0.05 % ophthalmic emulsion Place 1 drop into both eyes 2 (two) times daily. 60 each 3   fluticasone (FLONASE ALLERGY RELIEF) 50 MCG/ACT nasal spray Place 1 spray into both nostrils daily. 16 g 3   Loteprednol Etabonate (EYSUVIS) 0.25 % SUSP Place 1 drop into both eyes 4 (four) times daily for up to 2 weeks. 8.3 mL 3   mirtazapine (REMERON) 15 MG tablet Take 1 tablet (15 mg total) by mouth once  at bedtime. 30 tablet 5   OVER THE COUNTER MEDICATION Acid reducer     OVER THE COUNTER MEDICATION Allergy relief     simvastatin (ZOCOR) 20 MG tablet Take 1 tablet (20 mg total) by mouth every evening. 30 tablet 11   No current facility-administered medications on file prior to visit.    No Known Allergies    PE Today's Vitals   08/31/23 1008  BP: 134/84  Pulse: 83  SpO2: 100%  Weight: 144 lb (65.3 kg)  Height: 5\' 5"  (1.651 m)   Body mass index is 23.96 kg/m.  Physical Exam Vitals reviewed. Exam conducted with a chaperone present.  Constitutional:      General: She is not in acute distress.    Appearance: Normal appearance.  HENT:     Head: Normocephalic and atraumatic.     Nose: Nose normal.  Eyes:     Extraocular Movements: Extraocular movements intact.     Conjunctiva/sclera: Conjunctivae normal.  Pulmonary:     Effort: Pulmonary effort is normal.  Genitourinary:    General: Normal vulva.     Exam position: Lithotomy position.     Vagina: No vaginal discharge.     Cervix: No cervical  motion tenderness, discharge or lesion.     Uterus: Normal. Not enlarged and not tender.      Adnexa: Right adnexa normal and left adnexa normal.     Comments: 2 vaginal canals and cervices, left vagina is anterior, enter at ~2:00, smaller left cervix/stump Musculoskeletal:        General: Normal range of motion.     Cervical back: Normal range of motion.  Neurological:     General: No focal deficit present.     Mental Status: She is alert.  Psychiatric:        Mood and Affect: Mood normal.        Behavior: Behavior normal.       Assessment and Plan:        Congenital uterine anomaly Assessment & Plan: Patient with known congenital anomaly. Patient reports 2 vaginas and 2 cervices. She believes she has also been pregnant on both sides of her uterus.  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  PAP collected x2, given 2 separate vaginas and cervices (left stump).  Adnexal mass was first noted in 2022 during complicated admission for appendicitis. She has not had f/u since due to insurance issues. It is unclear is the adnexal mass is related to her anomaly or true abnormal pathology.  Recommend TVUS to assess further.  Orders: -     US PELVIS TRANSVAGINAL NON-OB (TV ONLY); Future  Cervical cancer screening -     Cytology - PAP -     Cytology - PAP  Adnexal mass Assessment & Plan: Patient with known congenital anomaly. Patient reports 2 vaginas and 2 cervices. She believes she has also been pregnant on both sides of her uterus.  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  Adnexal mass was first noted in 2022 during complicated admission for appendicitis. She has not had f/u since due to insurance issues. It is unclear is the adnexal mass is related to her anomaly or true  abnormal pathology.  Recommend TVUS to assess further.  Orders: -     US PELVIS TRANSVAGINAL NON-OB (  TV ONLY); Future  Vasomotor symptoms due to menopause Assessment & Plan: Given HTN and HLD on medications, patient is not likely a candidate for hormonal therapy. Declines nonhormonal options at this time Continue expectant management.      Rosalyn Gess, MD

## 2023-08-31 NOTE — Assessment & Plan Note (Addendum)
Given HTN and HLD on medications, patient is not likely a candidate for hormonal therapy. Declines nonhormonal options at this time Continue expectant management.

## 2023-08-31 NOTE — Assessment & Plan Note (Addendum)
Patient with known congenital anomaly. Patient reports 2 vaginas and 2 cervices. She believes she has also been pregnant on both sides of her uterus.  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  Adnexal mass was first noted in 2022 during complicated admission for appendicitis. She has not had f/u since due to insurance issues. It is unclear is the adnexal mass is related to her anomaly or true abnormal pathology.  Recommend TVUS to assess further.

## 2023-09-04 LAB — CYTOLOGY - PAP
Comment: NEGATIVE
Comment: NEGATIVE
Diagnosis: NEGATIVE
Diagnosis: NEGATIVE
High risk HPV: NEGATIVE
High risk HPV: NEGATIVE

## 2023-09-04 NOTE — Assessment & Plan Note (Addendum)
Patient with known congenital anomaly. Patient reports 2 vaginas and 2 cervices. She believes she has also been pregnant on both sides of her uterus.  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  PAP collected x2, given 2 separate vaginas and cervices (left stump).  Adnexal mass was first noted in 2022 during complicated admission for appendicitis. She has not had f/u since due to insurance issues. It is unclear is the adnexal mass is related to her anomaly or true abnormal pathology.  Recommend TVUS to assess further.

## 2023-09-14 ENCOUNTER — Telehealth: Payer: Self-pay | Admitting: Family Medicine

## 2023-09-14 NOTE — Telephone Encounter (Signed)
[facility] fax over they are not in network with [place] medicaid [facility] community plan. Please refer elsewhere

## 2023-09-14 NOTE — Telephone Encounter (Signed)
Noted  

## 2023-09-14 NOTE — Telephone Encounter (Signed)
Sent to [facility]

## 2023-10-11 ENCOUNTER — Other Ambulatory Visit: Payer: Self-pay

## 2023-10-11 ENCOUNTER — Other Ambulatory Visit: Payer: Self-pay | Admitting: Family Medicine

## 2023-10-11 DIAGNOSIS — F419 Anxiety disorder, unspecified: Secondary | ICD-10-CM

## 2023-10-11 DIAGNOSIS — F339 Major depressive disorder, recurrent, unspecified: Secondary | ICD-10-CM

## 2023-10-11 MED ORDER — MIRTAZAPINE 15 MG PO TABS
15.0000 mg | ORAL_TABLET | Freq: Every day | ORAL | 5 refills | Status: DC
  Filled 2023-10-11: qty 30, 30d supply, fill #0

## 2023-10-17 ENCOUNTER — Other Ambulatory Visit: Payer: Self-pay

## 2023-10-31 ENCOUNTER — Encounter: Payer: Self-pay | Admitting: Pediatrics

## 2023-10-31 ENCOUNTER — Telehealth: Payer: Self-pay | Admitting: Family Medicine

## 2023-10-31 NOTE — Telephone Encounter (Signed)
Pt is requesting a call back--stated she was saw Dr. Casimiro Needle a few months ago.  She was supposed to hear something about a colonoscopy but no one ever contacted her from another office about that.

## 2023-10-31 NOTE — Telephone Encounter (Signed)
Spoke with the patient and informed her per notes in the referral, [facility] has tried to reach her twice with no return call.  Patient was given the phone number of [phone] to contact the GI office for an appt.

## 2023-11-07 ENCOUNTER — Other Ambulatory Visit: Payer: Self-pay

## 2023-11-07 ENCOUNTER — Ambulatory Visit (AMBULATORY_SURGERY_CENTER): Payer: Medicaid Other

## 2023-11-07 VITALS — Ht 65.0 in | Wt 137.0 lb

## 2023-11-07 DIAGNOSIS — Z1211 Encounter for screening for malignant neoplasm of colon: Secondary | ICD-10-CM

## 2023-11-07 MED ORDER — PEG 3350-KCL-NA BICARB-NACL 420 G PO SOLR
4000.0000 mL | Freq: Once | ORAL | 0 refills | Status: AC
  Filled 2023-11-07: qty 4000, 1d supply, fill #0

## 2023-11-07 MED ORDER — ONDANSETRON HCL 4 MG PO TABS
4.0000 mg | ORAL_TABLET | Freq: Three times a day (TID) | ORAL | 1 refills | Status: DC | PRN
  Filled 2023-11-07: qty 30, 10d supply, fill #0

## 2023-11-07 NOTE — Progress Notes (Signed)

## 2023-11-09 ENCOUNTER — Other Ambulatory Visit: Payer: Self-pay

## 2023-11-12 ENCOUNTER — Other Ambulatory Visit: Payer: Self-pay

## 2023-12-03 ENCOUNTER — Encounter: Payer: Self-pay | Admitting: Pediatrics

## 2023-12-03 ENCOUNTER — Ambulatory Visit (AMBULATORY_SURGERY_CENTER): Payer: Medicaid Other | Admitting: Pediatrics

## 2023-12-03 VITALS — BP 107/74 | HR 83 | Temp 97.3°F | Resp 20 | Ht 65.0 in | Wt 137.0 lb

## 2023-12-03 DIAGNOSIS — D120 Benign neoplasm of cecum: Secondary | ICD-10-CM | POA: Diagnosis not present

## 2023-12-03 DIAGNOSIS — D122 Benign neoplasm of ascending colon: Secondary | ICD-10-CM

## 2023-12-03 DIAGNOSIS — D125 Benign neoplasm of sigmoid colon: Secondary | ICD-10-CM

## 2023-12-03 DIAGNOSIS — K635 Polyp of colon: Secondary | ICD-10-CM | POA: Diagnosis not present

## 2023-12-03 DIAGNOSIS — K648 Other hemorrhoids: Secondary | ICD-10-CM

## 2023-12-03 DIAGNOSIS — Z83719 Family history of colon polyps, unspecified: Secondary | ICD-10-CM | POA: Diagnosis not present

## 2023-12-03 DIAGNOSIS — K625 Hemorrhage of anus and rectum: Secondary | ICD-10-CM

## 2023-12-03 DIAGNOSIS — D123 Benign neoplasm of transverse colon: Secondary | ICD-10-CM

## 2023-12-03 DIAGNOSIS — K5730 Diverticulosis of large intestine without perforation or abscess without bleeding: Secondary | ICD-10-CM

## 2023-12-03 DIAGNOSIS — I10 Essential (primary) hypertension: Secondary | ICD-10-CM | POA: Diagnosis not present

## 2023-12-03 DIAGNOSIS — F32A Depression, unspecified: Secondary | ICD-10-CM | POA: Diagnosis not present

## 2023-12-03 DIAGNOSIS — F419 Anxiety disorder, unspecified: Secondary | ICD-10-CM | POA: Diagnosis not present

## 2023-12-03 DIAGNOSIS — Z1211 Encounter for screening for malignant neoplasm of colon: Secondary | ICD-10-CM | POA: Diagnosis not present

## 2023-12-03 DIAGNOSIS — E785 Hyperlipidemia, unspecified: Secondary | ICD-10-CM | POA: Diagnosis not present

## 2023-12-03 DIAGNOSIS — K644 Residual hemorrhoidal skin tags: Secondary | ICD-10-CM

## 2023-12-03 DIAGNOSIS — D124 Benign neoplasm of descending colon: Secondary | ICD-10-CM

## 2023-12-03 MED ORDER — SODIUM CHLORIDE 0.9 % IV SOLN: 500.0000 mL | Freq: Once | INTRAVENOUS | Status: DC

## 2023-12-03 NOTE — Op Note (Signed)
 [facility] Patient Name: Tracy Fischer Procedure Date: 12/03/2023 9:38 AM MRN: [id number] Endoscopist: Inocente Hausen , MD,  Age: 58 Referring MD:  Date of Birth: August 08, 1966 Gender: Female Account #: [id number] Procedure:                Colonoscopy Indications:              Colon cancer screening in patient at increased                            risk: Family history of 1st-degree relative with                            colon polyps, This is the patient's first                            colonoscopy Medicines:                Monitored Anesthesia Care Procedure:                Pre-Anesthesia Assessment:                           - Prior to the procedure, a History and Physical                            was performed, and patient medications and                            allergies were reviewed. The patient's tolerance of                            previous anesthesia was also reviewed. The risks                            and benefits of the procedure and the sedation                            options and risks were discussed with the patient.                            All questions were answered, and informed consent                            was obtained. Prior Anticoagulants: The patient has                            taken no anticoagulant or antiplatelet agents. ASA                            Grade Assessment: II - A patient with mild systemic                            disease. After reviewing the risks and benefits,  the patient was deemed in satisfactory condition to                            undergo the procedure.                           After obtaining informed consent, the colonoscope                            was passed under direct vision. Throughout the                            procedure, the patient's blood pressure, pulse, and                            oxygen saturations were monitored continuously. The                             Olympus Scope SN: [id number] was introduced through                            the anus and advanced to the cecum, identified by                            appendiceal orifice and ileocecal valve. The                            colonoscopy was performed without difficulty. The                            patient tolerated the procedure well. The quality                            of the bowel preparation was evaluated using the                            BBPS [facility] Bowel Preparation Scale) with scores                            of: Right Colon = 3 (entire mucosa seen well with                            no residual staining, small fragments of stool or                            opaque liquid), Transverse Colon = 3 (entire mucosa                            seen well with no residual staining, small                            fragments of stool or opaque liquid) and Left Colon                            =  2 (minor amount of residual staining, small                            fragments of stool and/or opaque liquid, but mucosa                            seen well). The total BBPS score equals 8. Scope In: 10:07:41 AM Scope Out: 10:37:44 AM Scope Withdrawal Time: 0 hours 23 minutes 14 seconds  Total Procedure Duration: 0 hours 30 minutes 3 seconds  Findings:                 Skin tags were found on perianal exam.                           The digital rectal exam was normal. Pertinent                            negatives include normal sphincter tone and no                            palpable rectal lesions.                           A few small-mouthed diverticula were found in the                            sigmoid colon.                           Seven sessile polyps were found in the sigmoid                            colon, descending colon, transverse colon and                            cecum. The polyps were 3 to 6 mm in size. These                            polyps were  removed with a cold snare. Resection                            and retrieval were complete except one small polyp                            was removed but not retrieved in the trap.                           An 8 mm polyp was found in the ascending colon. The                            polyp was sessile. The polyp was removed with a hot  snare. Resection and retrieval were complete.                           A 3 mm polyp was found in the transverse colon. The                            polyp was sessile. The polyp was removed with a                            cold biopsy forceps. Resection and retrieval were                            complete.                           Internal hemorrhoids were found during retroflexion. Complications:            No immediate complications. Estimated blood loss:                            Minimal. Estimated Blood Loss:     Estimated blood loss was minimal. Impression:               - Perianal skin tags found on perianal exam.                           - Diverticulosis in the sigmoid colon.                           - Seven 3 to 6 mm polyps in the sigmoid colon, in                            the descending colon, in the transverse colon and                            in the cecum, removed with a cold snare. Resected                            and retrieved except one small polyp was removed                            but not retrieved in the trap.                           - One 8 mm polyp in the ascending colon, removed                            with a hot snare. Resected and retrieved.                           - One 3 mm polyp in the transverse colon, removed                            with a cold biopsy  forceps. Resected and retrieved.                           - Internal hemorrhoids. Recommendation:           - Discharge patient to home (ambulatory).                           - Await pathology results.                            - Repeat colonoscopy for surveillance based on                            pathology results.                           - The findings and recommendations were discussed                            with the patient's family.                           - Return to referring physician.                           - Patient has a contact number available for                            emergencies. The signs and symptoms of potential                            delayed complications were discussed with the                            patient. Return to normal activities tomorrow.                            Written discharge instructions were provided to the                            patient. Inocente Hausen, MD 12/03/2023 10:46:01 AM

## 2023-12-03 NOTE — Progress Notes (Signed)
 [facility] History and Physical   Primary Care Physician:  Ozell Heron HERO, MD   Reason for Procedure:  Screening colonoscopy  Plan:    Screening colonoscopy     HPI: Tracy Fischer is a 57 y.o. female undergoing average risk screening colonoscopy.  No previous colonoscopy.  Reports a pertinent family history of polyps of unknown histology in her father.  No colon cancer.  patient has a personal history of appendectomy.   Past Medical History:  Diagnosis Date   Allergies    Anxiety    Asthma    Depression    Drug addiction in remission (HCC)    Fibroid    GERD (gastroesophageal reflux disease)    History of chickenpox    Hyperlipidemia    Hypertension    Migraines    Preterm labor    Vaginal Pap smear, abnormal     Past Surgical History:  Procedure Laterality Date   APPENDECTOMY     BREAST BIOPSY Right 08/01/2023   US  RT BREAST BX W LOC DEV 1ST LESION IMG BX SPEC US  GUIDE 08/01/2023 [facility]   CESAREAN SECTION     CESAREAN SECTION     LAPAROSCOPIC APPENDECTOMY N/A 03/17/2021   Procedure: APPENDECTOMY LAPAROSCOPIC;  Surgeon: Lyndel Deward PARAS, MD;  Location: [facility];  Service: General;  Laterality: N/A;    Prior to Admission medications   Medication Sig Start Date End Date Taking? Authorizing Provider  acetaminophen  (TYLENOL ) 500 MG tablet Take 2 tablets (1,000 mg total) by mouth every 8 (eight) hours as needed for mild pain. Patient not taking: Reported on 11/07/2023 03/24/21   Maczis, Michael M, PA-C  amLODipine  (NORVASC ) 10 MG tablet Take 1 tablet (10 mg total) by mouth daily. 07/09/23     carvedilol  (COREG ) 3.125 MG tablet Take 1 tablet (3.125 mg total) by mouth 2 (two) times daily. 07/09/23     cycloSPORINE  (RESTASIS ) 0.05 % ophthalmic emulsion Place 1 drop into both eyes 2 (two) times daily. 03/03/23     fluticasone  (FLONASE  ALLERGY RELIEF) 50 MCG/ACT nasal spray Place 1 spray into both nostrils daily. 03/02/23     Loteprednol   Etabonate (EYSUVIS ) 0.25 % SUSP Place 1 drop into both eyes 4 (four) times daily for up to 2 weeks. 03/03/23     mirtazapine  (REMERON ) 15 MG tablet Take 1 tablet (15 mg total) by mouth once at bedtime. 10/11/23   Ozell Heron HERO, MD  ondansetron  (ZOFRAN ) 4 MG tablet Take 1 tablet (4 mg total) by mouth every 8 (eight) hours as needed for nausea or vomiting. 11/07/23   Oaklen Thiam, Inocente HERO, MD  OVER THE COUNTER MEDICATION Acid reducer    [provider]  OVER THE COUNTER MEDICATION Allergy relief    [provider]  simvastatin  (ZOCOR ) 20 MG tablet Take 1 tablet (20 mg total) by mouth every evening. 11/06/22 12/07/23  Oley Bascom RAMAN, NP    Current Outpatient Medications  Medication Sig Dispense Refill   acetaminophen  (TYLENOL ) 500 MG tablet Take 2 tablets (1,000 mg total) by mouth every 8 (eight) hours as needed for mild pain. (Patient not taking: Reported on 11/07/2023) 30 tablet 0   amLODipine  (NORVASC ) 10 MG tablet Take 1 tablet (10 mg total) by mouth daily. 30 tablet 3   carvedilol  (COREG ) 3.125 MG tablet Take 1 tablet (3.125 mg total) by mouth 2 (two) times daily. 60 tablet 3   cycloSPORINE  (RESTASIS ) 0.05 % ophthalmic emulsion Place 1 drop into both eyes 2 (two) times daily.  60 each 3   fluticasone  (FLONASE  ALLERGY RELIEF) 50 MCG/ACT nasal spray Place 1 spray into both nostrils daily. 16 g 3   Loteprednol  Etabonate (EYSUVIS ) 0.25 % SUSP Place 1 drop into both eyes 4 (four) times daily for up to 2 weeks. 8.3 mL 3   mirtazapine  (REMERON ) 15 MG tablet Take 1 tablet (15 mg total) by mouth once at bedtime. 30 tablet 5   ondansetron  (ZOFRAN ) 4 MG tablet Take 1 tablet (4 mg total) by mouth every 8 (eight) hours as needed for nausea or vomiting. 30 tablet 1   OVER THE COUNTER MEDICATION Acid reducer     OVER THE COUNTER MEDICATION Allergy relief     simvastatin  (ZOCOR ) 20 MG tablet Take 1 tablet (20 mg total) by mouth every evening. 30 tablet 11   No current facility-administered  medications for this visit.    Allergies as of 12/03/2023   (No Known Allergies)    Family History  Problem Relation Age of Onset   Hyperlipidemia Mother    Depression Mother    Colon polyps Father    Asthma Father    Hyperlipidemia Father    Diabetes Mellitus II Father    Hypertension Father    Cancer Maternal Aunt    Colon cancer Neg Hx    Esophageal cancer Neg Hx    Rectal cancer Neg Hx    Stomach cancer Neg Hx     Social History   Socioeconomic History   Marital status: Single    Spouse name: Not on file   Number of children: 2   Years of education: Not on file   Highest education level: Not on file  Occupational History   Not on file  Tobacco Use   Smoking status: Every Day    Average packs/day: 0.8 packs/day for 80.0 years (60.0 ttl pk-yrs)    Types: Cigarettes    Start date: 9   Smokeless tobacco: Never   Tobacco comments:    1 Pack every 2 to 3 days  Vaping Use   Vaping status: Never Used  Substance and Sexual Activity   Alcohol use: Yes    Comment: 1-2 glasses of wine/week   Drug use: Yes    Frequency: 2.0 times per week    Types: Marijuana    Comment: helps with nausea & anxiety   Sexual activity: Not Currently    Partners: Male    Birth control/protection: Post-menopausal  Other Topics Concern   Not on file  Social History Narrative   Not on file   Social Drivers of Health   Financial Resource Strain: Not on file  Food Insecurity: Not on file  Transportation Needs: Not on file  Physical Activity: Not on file  Stress: Not on file  Social Connections: Not on file  Intimate Partner Violence: Not on file    Review of Systems:  All other review of systems negative except as mentioned in the HPI.  Physical Exam: Vital signs LMP 04/21/2015 (Approximate) Comment: Perimenopausal  General:   Alert,  Well-developed, well-nourished, pleasant and cooperative in NAD Airway:  Mallampati 2 Lungs:  Clear throughout to auscultation.   Heart:   Regular rate and rhythm; no murmurs, clicks, rubs,  or gallops. Abdomen:  Soft, nontender and nondistended. Normal bowel sounds.   Neuro/Psych:  Normal mood and affect. A and O x 3  Inocente Hausen, MD [facility]

## 2023-12-03 NOTE — Progress Notes (Signed)
 Pt denies need to pass gas while in the RR.  Abdomen soft, easily palpable; pt denies abdominal pain or cramping.   Encouraged to ambulate, drink warm fluids if develops discomfort; understanding voiced

## 2023-12-03 NOTE — Progress Notes (Signed)
 Pt resting comfortably. VSS. Airway intact. SBAR complete to RN. All questions answered.

## 2023-12-03 NOTE — Patient Instructions (Addendum)
 Await pathology results. Repeat colonoscopy for surveillance based on pathology results.    Please read over handouts provided                YOU HAD AN ENDOSCOPIC PROCEDURE TODAY AT THE [facility]:   Refer to the procedure report that was given to you for any specific questions about what was found during the examination.  If the procedure report does not answer your questions, please call your gastroenterologist to clarify.  If you requested that your care partner not be given the details of your procedure findings, then the procedure report has been included in a sealed envelope for you to review at your convenience later.  YOU SHOULD EXPECT: Some feelings of bloating in the abdomen. Passage of more gas than usual.  Walking can help get rid of the air that was put into your GI tract during the procedure and reduce the bloating. If you had a lower endoscopy (such as a colonoscopy or flexible sigmoidoscopy) you may notice spotting of blood in your stool or on the toilet paper. If you underwent a bowel prep for your procedure, you may not have a normal bowel movement for a few days.  Please Note:  You might notice some irritation and congestion in your nose or some drainage.  This is from the oxygen used during your procedure.  There is no need for concern and it should clear up in a day or so.  SYMPTOMS TO REPORT IMMEDIATELY:  Following lower endoscopy (colonoscopy or flexible sigmoidoscopy):  Excessive amounts of blood in the stool  Significant tenderness or worsening of abdominal pains  Swelling of the abdomen that is new, acute  Fever of 100F or higher  For urgent or emergent issues, a gastroenterologist can be reached at any hour by calling [phone]. Do not use MyChart messaging for urgent concerns.    DIET:  We do recommend a small meal at first, but then you may proceed to your regular diet.  Drink plenty of fluids but you should avoid alcoholic beverages for 24  hours.  ACTIVITY:  You should plan to take it easy for the rest of today and you should NOT DRIVE or use heavy machinery until tomorrow (because of the sedation medicines used during the test).    FOLLOW UP: Our staff will call the number listed on your records the next business day following your procedure.  We will call around 7:15- 8:00 am to check on you and address any questions or concerns that you may have regarding the information given to you following your procedure. If we do not reach you, we will leave a message.     If any biopsies were taken you will be contacted by phone or by letter within the next 1-3 weeks.  Please call us  at [phone] if you have not heard about the biopsies in 3 weeks.    SIGNATURES/CONFIDENTIALITY: You and/or your care partner have signed paperwork which will be entered into your electronic medical record.  These signatures attest to the fact that that the information above on your After Visit Summary has been reviewed and is understood.  Full responsibility of the confidentiality of this discharge information lies with you and/or your care-partner.

## 2023-12-03 NOTE — Progress Notes (Signed)
 Called to room to assist during endoscopic procedure.  Patient ID and intended procedure confirmed with present staff. Received instructions for my participation in the procedure from the performing physician.

## 2023-12-03 NOTE — Progress Notes (Signed)
 Pt's states no medical or surgical changes since previsit or office visit.

## 2023-12-04 ENCOUNTER — Telehealth: Payer: Self-pay

## 2023-12-04 ENCOUNTER — Telehealth: Payer: Self-pay | Admitting: Family Medicine

## 2023-12-04 NOTE — Telephone Encounter (Signed)
  Follow up Call-     12/03/2023    9:49 AM  Call back number  Post procedure Call Back phone  # [phone]  Permission to leave phone message Yes     Patient questions:  Do you have a fever, pain , or abdominal swelling? No. Pain Score  0 *  Have you tolerated food without any problems? Yes.    Have you been able to return to your normal activities? Yes.    Do you have any questions about your discharge instructions: Diet   No. Medications  No. Follow up visit  No.  Do you have questions or concerns about your Care? No.  Actions: * If pain score is 4 or above: No action needed, pain <4.

## 2023-12-04 NOTE — Telephone Encounter (Signed)
 Copied from CRM [id number]. Topic: Referral - Status >> Dec 04, 2023  1:42 PM Macario HERO wrote: Reason for CRM: Patient called and stated that she spoke with Dr. Ozell regarding a referral to a Dermatologist and she also changed her phone recently so she is not sure if a call was made to her yet regarding that. She is requesting a status update.  Attempted to call patient to advise that the referral went to [facility], they can be reached at [phone]. Patient's voicemail was not set up, not able to leave a message. If patient calls, provide her with the number to [facility]

## 2023-12-05 LAB — SURGICAL PATHOLOGY

## 2023-12-06 ENCOUNTER — Encounter: Payer: Self-pay | Admitting: Pediatrics

## 2023-12-25 ENCOUNTER — Other Ambulatory Visit: Payer: Self-pay | Admitting: Nurse Practitioner

## 2023-12-25 ENCOUNTER — Other Ambulatory Visit: Payer: Self-pay

## 2023-12-25 DIAGNOSIS — E782 Mixed hyperlipidemia: Secondary | ICD-10-CM

## 2023-12-25 MED ORDER — SIMVASTATIN 20 MG PO TABS
20.0000 mg | ORAL_TABLET | Freq: Every evening | ORAL | 11 refills | Status: DC
  Filled 2023-12-25: qty 30, 30d supply, fill #0
  Filled 2024-01-24: qty 30, 30d supply, fill #1
  Filled 2024-04-15: qty 30, 30d supply, fill #2
  Filled 2024-05-15: qty 30, 30d supply, fill #3
  Filled 2024-06-25: qty 30, 30d supply, fill #4
  Filled 2024-07-22 – 2024-08-05 (×2): qty 30, 30d supply, fill #5

## 2023-12-26 ENCOUNTER — Other Ambulatory Visit: Payer: Self-pay | Admitting: Family Medicine

## 2023-12-26 ENCOUNTER — Other Ambulatory Visit: Payer: Self-pay

## 2023-12-26 MED ORDER — CARVEDILOL 3.125 MG PO TABS
3.1250 mg | ORAL_TABLET | Freq: Two times a day (BID) | ORAL | 3 refills | Status: DC
  Filled 2023-12-26: qty 60, 30d supply, fill #0
  Filled 2024-01-24: qty 60, 30d supply, fill #1
  Filled 2024-03-27: qty 60, 30d supply, fill #2
  Filled 2024-04-15 – 2024-05-07 (×2): qty 60, 30d supply, fill #3

## 2023-12-26 MED ORDER — AMLODIPINE BESYLATE 10 MG PO TABS
10.0000 mg | ORAL_TABLET | Freq: Every day | ORAL | 3 refills | Status: DC
  Filled 2023-12-26: qty 30, 30d supply, fill #0
  Filled 2024-01-24: qty 30, 30d supply, fill #1
  Filled 2024-03-13: qty 30, 30d supply, fill #2
  Filled 2024-04-15: qty 30, 30d supply, fill #3

## 2023-12-28 ENCOUNTER — Other Ambulatory Visit: Payer: Self-pay

## 2024-01-11 ENCOUNTER — Ambulatory Visit (INDEPENDENT_AMBULATORY_CARE_PROVIDER_SITE_OTHER): Payer: Medicaid Other | Admitting: Obstetrics and Gynecology

## 2024-01-11 ENCOUNTER — Encounter: Payer: Self-pay | Admitting: Obstetrics and Gynecology

## 2024-01-11 VITALS — BP 138/78 | HR 76 | Temp 97.6°F | Ht 65.0 in | Wt 142.0 lb

## 2024-01-11 DIAGNOSIS — N9489 Other specified conditions associated with female genital organs and menstrual cycle: Secondary | ICD-10-CM

## 2024-01-11 NOTE — Progress Notes (Signed)
 Patient presented for f/u appt. However [place] has not been scheduled. Patient has no new concerns today. Due to scheduling error, appt cancelled and [place] reordered. No charge encounter.

## 2024-01-28 ENCOUNTER — Other Ambulatory Visit: Payer: Self-pay

## 2024-01-28 ENCOUNTER — Other Ambulatory Visit: Payer: Self-pay | Admitting: Family Medicine

## 2024-01-28 DIAGNOSIS — R921 Mammographic calcification found on diagnostic imaging of breast: Secondary | ICD-10-CM

## 2024-01-29 ENCOUNTER — Other Ambulatory Visit: Payer: Self-pay | Admitting: Obstetrics and Gynecology

## 2024-01-29 ENCOUNTER — Telehealth: Payer: Self-pay | Admitting: *Deleted

## 2024-01-29 DIAGNOSIS — R921 Mammographic calcification found on diagnostic imaging of breast: Secondary | ICD-10-CM

## 2024-01-29 NOTE — Telephone Encounter (Signed)
 Denise from [facility] left message.  Patient is scheduled for Bilateral Dx MMG and right BR [place] on 01/30/24 for 6 month f/u and annual screening.   Orders placed by [facility] for cosign by Dr. Kennith Center.  Was advised orders need to be signed off by end of business day to keep appt as scheduled.    Dr. Kennith Center -can you review and let me know once this has been completed.

## 2024-01-29 NOTE — Telephone Encounter (Signed)
 Order signed by Dr. Kennith Center.   Encounter closed.

## 2024-01-30 ENCOUNTER — Ambulatory Visit
Admission: RE | Admit: 2024-01-30 | Discharge: 2024-01-30 | Disposition: A | Discharge status: Home / Self Care | Payer: Medicaid Other | Source: Ambulatory Visit | Attending: Physician Assistant | Admitting: Physician Assistant

## 2024-01-30 ENCOUNTER — Ambulatory Visit
Admission: RE | Admit: 2024-01-30 | Discharge: 2024-01-30 | Disposition: A | Discharge status: Home / Self Care | Source: Ambulatory Visit | Attending: Obstetrics and Gynecology | Admitting: Obstetrics and Gynecology

## 2024-01-30 ENCOUNTER — Other Ambulatory Visit: Payer: Self-pay | Admitting: Obstetrics and Gynecology

## 2024-01-30 DIAGNOSIS — N6315 Unspecified lump in the right breast, overlapping quadrants: Secondary | ICD-10-CM | POA: Diagnosis not present

## 2024-01-30 DIAGNOSIS — R921 Mammographic calcification found on diagnostic imaging of breast: Secondary | ICD-10-CM

## 2024-01-30 DIAGNOSIS — N6312 Unspecified lump in the right breast, upper inner quadrant: Secondary | ICD-10-CM | POA: Diagnosis not present

## 2024-01-30 DIAGNOSIS — N6313 Unspecified lump in the right breast, lower outer quadrant: Secondary | ICD-10-CM | POA: Diagnosis not present

## 2024-01-30 DIAGNOSIS — N6325 Unspecified lump in the left breast, overlapping quadrants: Secondary | ICD-10-CM | POA: Diagnosis not present

## 2024-01-31 ENCOUNTER — Other Ambulatory Visit: Payer: Self-pay | Admitting: Obstetrics and Gynecology

## 2024-01-31 ENCOUNTER — Ambulatory Visit (INDEPENDENT_AMBULATORY_CARE_PROVIDER_SITE_OTHER): Payer: Medicaid Other

## 2024-01-31 ENCOUNTER — Ambulatory Visit (INDEPENDENT_AMBULATORY_CARE_PROVIDER_SITE_OTHER): Payer: Medicaid Other | Admitting: Obstetrics and Gynecology

## 2024-01-31 ENCOUNTER — Encounter: Payer: Self-pay | Admitting: Obstetrics and Gynecology

## 2024-01-31 VITALS — BP 112/64 | HR 79 | Temp 98.4°F | Wt 137.0 lb

## 2024-01-31 DIAGNOSIS — Q519 Congenital malformation of uterus and cervix, unspecified: Secondary | ICD-10-CM

## 2024-01-31 DIAGNOSIS — N9489 Other specified conditions associated with female genital organs and menstrual cycle: Secondary | ICD-10-CM | POA: Diagnosis not present

## 2024-01-31 DIAGNOSIS — Z124 Encounter for screening for malignant neoplasm of cervix: Secondary | ICD-10-CM

## 2024-01-31 DIAGNOSIS — D251 Intramural leiomyoma of uterus: Secondary | ICD-10-CM | POA: Diagnosis not present

## 2024-01-31 DIAGNOSIS — D252 Subserosal leiomyoma of uterus: Secondary | ICD-10-CM | POA: Diagnosis not present

## 2024-01-31 DIAGNOSIS — R928 Other abnormal and inconclusive findings on diagnostic imaging of breast: Secondary | ICD-10-CM

## 2024-01-31 DIAGNOSIS — N951 Menopausal and female climacteric states: Secondary | ICD-10-CM

## 2024-01-31 NOTE — Assessment & Plan Note (Signed)
 As noted above

## 2024-01-31 NOTE — Addendum Note (Signed)
 Addended by: Rosalyn Gess on: 01/31/2024 04:33 PM   Modules accepted: Orders, Level of Service

## 2024-01-31 NOTE — Assessment & Plan Note (Signed)
 Patient with known uterine anomaly, however unable to review prior ultrasound reports. 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  Left pelvic mass in question is consistent with left uterine horn and subserosal fibroid.  Discussed pathology of fibroids and benign nature. Reviewed that management of fibroids is dependent upon associated symptoms. Asymptomatic fibroids can be managed expectantly. However, fibroids can cause AUB and bulk symptoms, including dysmenorrhea, pelvic and lower back pain and pressure, dyspareunia, and constipation.  Will continue with expectant management at this time.

## 2024-01-31 NOTE — Progress Notes (Addendum)
 58 y.o. 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  Patient's last menstrual period was 04/21/2015 (approximate).  At previous appt, she notes: "Patient reports left sided adnexal mass causing intermittent pain. Notes soreness/sharpness of LLQ. Currently has a dull pain, rated 1/10. It sometimes feels like pressure "like uterus is falling" Worse with walking and sitting, at its worse rated: 7-8/10. Last felt a couple of months ago, also has to sleep with pillow in between legs.  Current concerns for adnexal mass was first noted in 2022 during complicated admission for appendicitis. She has not had f/u since due to insurance issues.  No PMB. No vaginal itching, irritation."  Today she reports that pain only happens occasionally.  She has not had any pain episode since her last appointment.  She denies postmenopausal bleeding.    Last mammogram: s/p benign biopsy 2024, screening MMG due 2025 Last colonoscopy: ordered by PCP Sexually active: no    GYN HISTORY: Uterine anomaly Hx of CD x2  OB History  Gravida Para Term Preterm AB Living  8 2 0 2 6 2   SAB IAB Ectopic Multiple Live Births  3 3 0 0 2    # Outcome Date GA Lbr Len/2nd Weight Sex Type Anes PTL Lv  8 IAB           7 IAB           6 IAB           5 SAB           4 SAB           3 SAB           2 Preterm           1 Preterm             Past Medical History:  Diagnosis Date   Allergies    Anxiety    Asthma    Depression    Drug addiction in remission (HCC)    Fibroid    GERD (gastroesophageal reflux disease)    History of chickenpox    Hyperlipidemia    Hypertension    Migraines    Preterm labor    Vaginal Pap smear, abnormal     Past Surgical History:  Procedure Laterality Date   APPENDECTOMY     BREAST BIOPSY Right 08/01/2023   [place] RT BREAST BX W LOC DEV 1ST LESION IMG BX SPEC US GUIDE 08/01/2023 [facility]  MAMMOGRAPHY   CESAREAN SECTION     CESAREAN SECTION     LAPAROSCOPIC APPENDECTOMY N/A 03/17/2021   Procedure: APPENDECTOMY LAPAROSCOPIC;  Surgeon: Quentin Ore, MD;  Location: [facility];  Service: General;  Laterality: N/A;    Current Outpatient Medications on File Prior to Visit  Medication Sig Dispense Refill   acetaminophen (TYLENOL) 500 MG tablet Take 2 tablets (1,000 mg total) by mouth every 8 (eight) hours as needed for mild pain. 30 tablet 0   amLODipine (NORVASC) 10 MG tablet Take 1 tablet (10 mg total) by mouth daily. 30 tablet 3   carvedilol (COREG) 3.125 MG tablet Take 1 tablet (3.125 mg total) by mouth 2 (two) times daily. 60 tablet 3   cycloSPORINE (RESTASIS) 0.05 % ophthalmic emulsion Place 1 drop into both eyes 2 (two) times daily. 60 each 3   fluticasone (FLONASE ALLERGY RELIEF) 50 MCG/ACT nasal spray Place 1 spray into both nostrils daily. 16  g 3   Loteprednol Etabonate (EYSUVIS) 0.25 % SUSP Place 1 drop into both eyes 4 (four) times daily for up to 2 weeks. 8.3 mL 3   mirtazapine (REMERON) 15 MG tablet Take 1 tablet (15 mg total) by mouth once at bedtime. 30 tablet 5   Multiple Vitamin (MULTI VITAMIN PO) Take by mouth.     ondansetron (ZOFRAN) 4 MG tablet Take 1 tablet (4 mg total) by mouth every 8 (eight) hours as needed for nausea or vomiting. 30 tablet 1   OVER THE COUNTER MEDICATION Acid reducer     OVER THE COUNTER MEDICATION Allergy relief     simvastatin (ZOCOR) 20 MG tablet Take 1 tablet (20 mg total) by mouth every evening. 30 tablet 11   No current facility-administered medications on file prior to visit.    No Known Allergies    PE Today's Vitals   01/31/24 1428  BP: 112/64  Pulse: 79  Temp: 98.4 F (36.9 C)  TempSrc: Oral  SpO2: 98%  Weight: 137 lb (62.1 kg)    Body mass index is 22.8 kg/m.  Physical Exam Vitals reviewed.  Constitutional:      General: She is not in acute distress.    Appearance: Normal appearance.  HENT:     Head:  Normocephalic and atraumatic.     Nose: Nose normal.  Eyes:     Extraocular Movements: Extraocular movements intact.     Conjunctiva/sclera: Conjunctivae normal.  Pulmonary:     Effort: Pulmonary effort is normal.  Musculoskeletal:        General: Normal range of motion.     Cervical back: Normal range of motion.  Neurological:     General: No focal deficit present.     Mental Status: She is alert.  Psychiatric:        Mood and Affect: Mood normal.        Behavior: Behavior normal.     01/31/24 TVUS: Indications: Known uterine anomaly, left pelvic mass  Findings:   Uterus: Didelphys uterus.   Right uterus measuring 5.0 x 3.3 x 2.4 cm, with 1.9 mm endometrial thickness.  3 fibroids measured, the largest being 1.3 x 1.0 cm. Left uterus measuring 5.5 x 3.7 x 3.0 cm, with 3.3 mm endometrial thickness.  4 fibroids measured, the largest being 3.0  x 3.0 cm, subserosal. Left ovary: Not visualized. Right ovary: 1.4 x 0.9 x 0.9 cm, normal-appearing. No free fluid.  Impression:  Didelphys uterus. Multifibroid uterine with intramural and subserosal fibroids.  Rosalyn Gess, MD    Assessment and Plan:        Congenital uterine anomaly Assessment & Plan: Patient with known uterine anomaly, however unable to review prior ultrasound reports. 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  Left pelvic mass in question is consistent with left uterine horn and subserosal fibroid.  Discussed pathology of fibroids and benign nature. Reviewed that management of fibroids is dependent upon associated symptoms. Asymptomatic fibroids can be managed expectantly. However, fibroids can cause AUB and bulk symptoms, including dysmenorrhea, pelvic and lower back pain and pressure, dyspareunia, and constipation.  Will continue with expectant management at this time.       Intramural and subserous leiomyoma of uterus Assessment & Plan: As noted  above   Abnormal mammogram -     MM 3D DIAGNOSTIC MAMMOGRAM BILATERAL BREAST; Future -     [place] LIMITED ULTRASOUND INCLUDING AXILLA LEFT BREAST ; Future -  [place] LIMITED ULTRASOUND INCLUDING AXILLA RIGHT BREAST; Future  01/30/2024 mammogram reviewed, BI-RADS 3, recommend diagnostic mammogram and bilateral ultrasound in 1 year.   Rosalyn Gess, MD

## 2024-02-18 ENCOUNTER — Encounter: Payer: Medicaid Other | Admitting: Family Medicine

## 2024-02-19 ENCOUNTER — Other Ambulatory Visit: Payer: Self-pay

## 2024-02-19 ENCOUNTER — Ambulatory Visit (INDEPENDENT_AMBULATORY_CARE_PROVIDER_SITE_OTHER): Payer: Medicaid Other | Admitting: Family Medicine

## 2024-02-19 ENCOUNTER — Encounter: Payer: Self-pay | Admitting: Family Medicine

## 2024-02-19 VITALS — BP 122/84 | HR 83 | Temp 98.2°F | Ht 65.25 in | Wt 139.9 lb

## 2024-02-19 DIAGNOSIS — F419 Anxiety disorder, unspecified: Secondary | ICD-10-CM

## 2024-02-19 DIAGNOSIS — Z0000 Encounter for general adult medical examination without abnormal findings: Secondary | ICD-10-CM | POA: Diagnosis not present

## 2024-02-19 DIAGNOSIS — F339 Major depressive disorder, recurrent, unspecified: Secondary | ICD-10-CM | POA: Diagnosis not present

## 2024-02-19 DIAGNOSIS — J301 Allergic rhinitis due to pollen: Secondary | ICD-10-CM | POA: Diagnosis not present

## 2024-02-19 MED ORDER — MIRTAZAPINE 15 MG PO TABS
7.5000 mg | ORAL_TABLET | Freq: Every day | ORAL | Status: AC
Start: 2024-02-19 — End: ?

## 2024-02-19 MED ORDER — FLUTICASONE PROPIONATE 50 MCG/ACT NA SUSP
1.0000 | Freq: Every day | NASAL | 3 refills | Status: AC
  Filled 2024-02-19: qty 16, 34d supply, fill #0
  Filled 2024-05-15: qty 16, 34d supply, fill #1
  Filled 2024-07-22 – 2024-08-05 (×2): qty 16, 34d supply, fill #2
  Filled 2024-11-17: qty 16, 34d supply, fill #3

## 2024-02-19 NOTE — Progress Notes (Signed)
 Complete physical exam  Patient: Tracy Fischer   DOB: [birth year]   58 y.o. Female  MRN: [id number]  Subjective:    Chief Complaint  Patient presents with   Annual Exam    Tracy Fischer is a 58 y.o. female who presents today for a complete physical exam. She reports consuming a general diet. Takes multivitamin, no herbal supplements The patient does not participate in regular exercise at present. She generally feels well. She reports sleeping sometimes anxiety interferes with sleep. She does not have additional problems to discuss today.   I asked the patient about her readiness to quit smoking, pt states she doesn't "feels ready" but will quit if she feels like she needs to.   Most recent fall risk assessment:    05/02/2022   11:07 AM  Fall Risk   Falls in the past year? 0  Number falls in past yr: 0  Injury with Fall? 0  Risk for fall due to : No Fall Risks  Follow up Falls evaluation completed     Most recent depression screenings:    02/19/2024    9:48 AM 08/16/2023    1:25 PM  PHQ 2/9 Scores  PHQ - 2 Score 2 2  PHQ- 9 Score 7 8    Vision:Within last year and Dental: No current dental problems and Receives regular dental care  Patient Active Problem List   Diagnosis Date Noted   Intramural and subserous leiomyoma of uterus 01/31/2024   Congenital uterine anomaly 08/31/2023   Vasomotor symptoms due to menopause 08/31/2023   Mixed hyperlipidemia 08/20/2023   Skin lesion of cheek 08/20/2023   Tobacco dependence syndrome 08/16/2023   Adnexal mass 01/02/2022   Allergic rhinitis 09/05/2021   Hypertensive disorder 09/05/2021      Patient Care Team: Tracy Georges, MD as PCP - General (Family Medicine)   Outpatient Medications Prior to Visit  Medication Sig   acetaminophen (TYLENOL) 500 MG tablet Take 2 tablets (1,000 mg total) by mouth every 8 (eight) hours as needed for mild pain.   amLODipine (NORVASC) 10 MG tablet Take 1 tablet (10 mg total) by  mouth daily.   carvedilol (COREG) 3.125 MG tablet Take 1 tablet (3.125 mg total) by mouth 2 (two) times daily.   cycloSPORINE (RESTASIS) 0.05 % ophthalmic emulsion Place 1 drop into both eyes 2 (two) times daily.   Loteprednol Etabonate (EYSUVIS) 0.25 % SUSP Place 1 drop into both eyes 4 (four) times daily for up to 2 weeks.   Multiple Vitamin (MULTI VITAMIN PO) Take by mouth.   OVER THE COUNTER MEDICATION Acid reducer   OVER THE COUNTER MEDICATION Allergy relief   simvastatin (ZOCOR) 20 MG tablet Take 1 tablet (20 mg total) by mouth every evening.   [DISCONTINUED] fluticasone (FLONASE ALLERGY RELIEF) 50 MCG/ACT nasal spray Place 1 spray into both nostrils daily.   [DISCONTINUED] mirtazapine (REMERON) 15 MG tablet Take 1 tablet (15 mg total) by mouth once at bedtime.   [DISCONTINUED] ondansetron (ZOFRAN) 4 MG tablet Take 1 tablet (4 mg total) by mouth every 8 (eight) hours as needed for nausea or vomiting.   No facility-administered medications prior to visit.    Review of Systems  HENT:  Negative for hearing loss.   Eyes:  Negative for blurred vision.  Respiratory:  Negative for shortness of breath.   Cardiovascular:  Negative for chest pain.  Gastrointestinal: Negative.   Genitourinary: Negative.   Musculoskeletal:  Negative for back pain.  Neurological:  Negative for headaches.  Psychiatric/Behavioral:  Negative for depression.        Objective:     BP 122/84   Pulse 83   Temp 98.2 F (36.8 C) (Oral)   Ht 5' 5.25" (1.657 m)   Wt 139 lb 14.4 oz (63.5 kg)   LMP 04/21/2015 (Approximate) Comment: Perimenopausal  SpO2 99%   BMI 23.10 kg/m    Physical Exam Vitals reviewed.  Constitutional:      Appearance: Normal appearance. She is well-groomed and normal weight.  HENT:     Right Ear: Tympanic membrane and ear canal normal.     Left Ear: Tympanic membrane and ear canal normal.     Mouth/Throat:     Mouth: Mucous membranes are moist.     Pharynx: No posterior  oropharyngeal erythema.  Eyes:     Conjunctiva/sclera: Conjunctivae normal.  Neck:     Thyroid: No thyromegaly.  Cardiovascular:     Rate and Rhythm: Normal rate and regular rhythm.     Pulses: Normal pulses.     Heart sounds: S1 normal and S2 normal.  Pulmonary:     Effort: Pulmonary effort is normal.     Breath sounds: Normal breath sounds and air entry.  Abdominal:     General: Abdomen is flat. Bowel sounds are normal.     Palpations: Abdomen is soft.  Musculoskeletal:     Right lower leg: No edema.     Left lower leg: No edema.  Lymphadenopathy:     Cervical: No cervical adenopathy.  Neurological:     Mental Status: She is alert and oriented to person, place, and time. Mental status is at baseline.     Gait: Gait is intact.  Psychiatric:        Mood and Affect: Mood and affect normal.        Speech: Speech normal.        Behavior: Behavior normal.        Judgment: Judgment normal.      No results found for any visits on 02/19/24.     Assessment & Plan:    Routine Health Maintenance and Physical Exam  Immunization History  Administered Date(s) Administered   Influenza,inj,Quad PF,6+ Mos 10/30/2022   PFIZER(Purple Top)SARS-COV-2 Vaccination 02/07/2020, 02/28/2020, 10/26/2020    Health Maintenance  Topic Date Due   Pneumococcal Vaccine 73-11 Years old (1 of 2 - PCV) Never done   DTaP/Tdap/Td (1 - Tdap) Never done   Zoster Vaccines- Shingrix (1 of 2) Never done   Lung Cancer Screening  Never done   COVID-19 Vaccine (4 - 2024-25 season) 07/22/2023   Hepatitis C Screening  08/15/2024 (Originally 04/02/1984)   INFLUENZA VACCINE  06/20/2024   MAMMOGRAM  01/29/2026   Colonoscopy  12/02/2026   Cervical Cancer Screening (HPV/Pap Cotest)  08/30/2028   HIV Screening  Completed   HPV VACCINES  Aged Out   Tobacco Use: High Risk (02/19/2024)   Patient History    Smoking Tobacco Use: Every Day    Smokeless Tobacco Use: Never    Passive Exposure: Not on file   Smoking  cessation instruction/counseling given:  counseled patient on the dangers of tobacco use, advised patient to stop smoking, and reviewed strategies to maximize success  Discussed health benefits of physical activity, and encouraged her to engage in regular exercise appropriate for her age and condition.  Seasonal allergic rhinitis due to pollen -     Fluticasone Propionate; Place 1 spray into both nostrils daily.  Dispense: 16 g; Refill: 3  Anxiety -     Mirtazapine; Take 0.5 tablets (7.5 mg total) by mouth at bedtime.  Episode of recurrent major depressive disorder, unspecified depression episode severity (HCC) -     Mirtazapine; Take 0.5 tablets (7.5 mg total) by mouth at bedtime.  Routine general medical examination at a health care facility  Normal physical exam findings today, I counseled the patient on er wax management, CDC recommendations for exercise, LDCT screening, I gently encouraged her to consider quitting smoking, pt states she is not ready yet. Handouts given on healthy eating and exercise. Pt is not yet due for her annual labs.   Return in 6 months (on 08/20/2024) for HTN.     Tracy Georges, MD

## 2024-02-19 NOTE — Patient Instructions (Addendum)
 Lung cancer screening is due-- let me know if you would like to have it done  Debrox ear drops or hydrogen peroxide in warm water as needed to help with ear wax.  Health Maintenance, Female Adopting a healthy lifestyle and getting preventive care are important in promoting health and wellness. Ask your health care provider about: The right schedule for you to have regular tests and exams. Things you can do on your own to prevent diseases and keep yourself healthy. What should I know about diet, weight, and exercise? Eat a healthy diet  Eat a diet that includes plenty of vegetables, fruits, low-fat dairy products, and lean protein. Do not eat a lot of foods that are high in solid fats, added sugars, or sodium. Maintain a healthy weight Body mass index (BMI) is used to identify weight problems. It estimates body fat based on height and weight. Your health care provider can help determine your BMI and help you achieve or maintain a healthy weight. Get regular exercise Get regular exercise. This is one of the most important things you can do for your health. Most adults should: Exercise for at least 150 minutes each week. The exercise should increase your heart rate and make you sweat (moderate-intensity exercise). Do strengthening exercises at least twice a week. This is in addition to the moderate-intensity exercise. Spend less time sitting. Even light physical activity can be beneficial. Watch cholesterol and blood lipids Have your blood tested for lipids and cholesterol at 58 years of age, then have this test every 5 years. Have your cholesterol levels checked more often if: Your lipid or cholesterol levels are high. You are older than 58 years of age. You are at high risk for heart disease. What should I know about cancer screening? Depending on your health history and family history, you may need to have cancer screening at various ages. This may include screening for: Breast  cancer. Cervical cancer. Colorectal cancer. Skin cancer. Lung cancer. What should I know about heart disease, diabetes, and high blood pressure? Blood pressure and heart disease High blood pressure causes heart disease and increases the risk of stroke. This is more likely to develop in people who have high blood pressure readings or are overweight. Have your blood pressure checked: Every 3-5 years if you are 73-75 years of age. Every year if you are 74 years old or older. Diabetes Have regular diabetes screenings. This checks your fasting blood sugar level. Have the screening done: Once every three years after age 39 if you are at a normal weight and have a low risk for diabetes. More often and at a younger age if you are overweight or have a high risk for diabetes. What should I know about preventing infection? Hepatitis B If you have a higher risk for hepatitis B, you should be screened for this virus. Talk with your health care provider to find out if you are at risk for hepatitis B infection. Hepatitis C Testing is recommended for: Everyone born from [birth year] through 1965. Anyone with known risk factors for hepatitis C. Sexually transmitted infections (STIs) Get screened for STIs, including gonorrhea and chlamydia, if: You are sexually active and are younger than 58 years of age. You are older than 58 years of age and your health care provider tells you that you are at risk for this type of infection. Your sexual activity has changed since you were last screened, and you are at increased risk for chlamydia or gonorrhea. Ask your health  care provider if you are at risk. Ask your health care provider about whether you are at high risk for HIV. Your health care provider may recommend a prescription medicine to help prevent HIV infection. If you choose to take medicine to prevent HIV, you should first get tested for HIV. You should then be tested every 3 months for as long as you are taking  the medicine. Pregnancy If you are about to stop having your period (premenopausal) and you may become pregnant, seek counseling before you get pregnant. Take 400 to 800 micrograms (mcg) of folic acid every day if you become pregnant. Ask for birth control (contraception) if you want to prevent pregnancy. Osteoporosis and menopause Osteoporosis is a disease in which the bones lose minerals and strength with aging. This can result in bone fractures. If you are 57 years old or older, or if you are at risk for osteoporosis and fractures, ask your health care provider if you should: Be screened for bone loss. Take a calcium or vitamin D supplement to lower your risk of fractures. Be given hormone replacement therapy (HRT) to treat symptoms of menopause. Follow these instructions at home: Alcohol use Do not drink alcohol if: Your health care provider tells you not to drink. You are pregnant, may be pregnant, or are planning to become pregnant. If you drink alcohol: Limit how much you have to: 0-1 drink a day. Know how much alcohol is in your drink. In the [place] one drink equals one 12 oz bottle of beer (355 mL), one 5 oz glass of wine (148 mL), or one 1 oz glass of hard liquor (44 mL). Lifestyle Do not use any products that contain nicotine or tobacco. These products include cigarettes, chewing tobacco, and vaping devices, such as e-cigarettes. If you need help quitting, ask your health care provider. Do not use street drugs. Do not share needles. Ask your health care provider for help if you need support or information about quitting drugs. General instructions Schedule regular health, dental, and eye exams. Stay current with your vaccines. Tell your health care provider if: You often feel depressed. You have ever been abused or do not feel safe at home. Summary Adopting a healthy lifestyle and getting preventive care are important in promoting health and wellness. Follow your health  care provider's instructions about healthy diet, exercising, and getting tested or screened for diseases. Follow your health care provider's instructions on monitoring your cholesterol and blood pressure. This information is not intended to replace advice given to you by your health care provider. Make sure you discuss any questions you have with your health care provider. Document Revised: 03/28/2021 Document Reviewed: 03/28/2021 Elsevier Patient Education  2024 [employer].

## 2024-02-20 ENCOUNTER — Other Ambulatory Visit: Payer: Self-pay

## 2024-03-10 ENCOUNTER — Other Ambulatory Visit: Payer: Self-pay

## 2024-03-13 ENCOUNTER — Other Ambulatory Visit: Payer: Self-pay | Admitting: Family Medicine

## 2024-03-13 ENCOUNTER — Other Ambulatory Visit: Payer: Self-pay

## 2024-03-13 MED ORDER — FLUCONAZOLE 150 MG PO TABS
150.0000 mg | ORAL_TABLET | Freq: Once | ORAL | 0 refills | Status: AC
  Filled 2024-03-13 (×2): qty 1, 1d supply, fill #0

## 2024-03-13 MED ORDER — CLINDAMYCIN HCL 300 MG PO CAPS
300.0000 mg | ORAL_CAPSULE | Freq: Four times a day (QID) | ORAL | 0 refills | Status: AC
  Filled 2024-03-13: qty 28, 7d supply, fill #0

## 2024-03-13 MED ORDER — CHLORHEXIDINE GLUCONATE 0.12 % MT SOLN
15.0000 mL | Freq: Two times a day (BID) | OROMUCOSAL | 0 refills | Status: AC
  Filled 2024-03-13: qty 473, 16d supply, fill #0

## 2024-03-18 ENCOUNTER — Other Ambulatory Visit: Payer: Self-pay

## 2024-03-19 ENCOUNTER — Other Ambulatory Visit: Payer: Self-pay

## 2024-03-20 ENCOUNTER — Other Ambulatory Visit: Payer: Self-pay

## 2024-03-28 ENCOUNTER — Other Ambulatory Visit: Payer: Self-pay

## 2024-04-15 ENCOUNTER — Other Ambulatory Visit: Payer: Self-pay

## 2024-05-09 ENCOUNTER — Other Ambulatory Visit: Payer: Self-pay

## 2024-05-13 ENCOUNTER — Ambulatory Visit: Payer: Medicaid Other | Admitting: Dermatology

## 2024-05-13 ENCOUNTER — Encounter: Payer: Self-pay | Admitting: Dermatology

## 2024-05-13 ENCOUNTER — Other Ambulatory Visit: Payer: Self-pay

## 2024-05-13 VITALS — BP 113/77 | HR 90

## 2024-05-13 DIAGNOSIS — L700 Acne vulgaris: Secondary | ICD-10-CM

## 2024-05-13 DIAGNOSIS — L709 Acne, unspecified: Secondary | ICD-10-CM | POA: Diagnosis not present

## 2024-05-13 DIAGNOSIS — L723 Sebaceous cyst: Secondary | ICD-10-CM

## 2024-05-13 DIAGNOSIS — L729 Follicular cyst of the skin and subcutaneous tissue, unspecified: Secondary | ICD-10-CM

## 2024-05-13 MED ORDER — TRETINOIN 0.025 % EX CREA
TOPICAL_CREAM | Freq: Every day | CUTANEOUS | 1 refills | Status: AC
  Filled 2024-05-13: qty 45, 30d supply, fill #0
  Filled 2024-07-22 – 2024-08-05 (×2): qty 45, 30d supply, fill #1

## 2024-05-13 NOTE — Progress Notes (Signed)
   New Patient Visit   Subjective  Tracy Fischer is a 58 y.o. female who presents for the following: Cyst  Patient states she has cyst located at the right cheek that she would like to have examined. Patient reports the areas have been there for several year. She reports the areas are bothersome. She state the area can become tender. Patient rates irritation 4 out of 10. She states that the areas has gotten bigger in size. Patient reports she has not previously been treated for these areas.   The following portions of the chart were reviewed this encounter and updated as appropriate: medications, allergies, medical history  Review of Systems:  No other skin or systemic complaints except as noted in HPI or Assessment and Plan.  Objective  Well appearing patient in no apparent distress; mood and affect are within normal limits.  A focused examination was performed of the following areas: Right Cheek  Relevant exam findings are noted in the Assessment and Plan.    Assessment & Plan   1. Sebaceous cyst - Assessment: Patient presents with an 8-millimeter soft cystic nodule on the right cheek, present for several years. The lesion is freely mobile and non-tender to touch. It has shown growth over time and turns dark when squeezed.  Pt is requesting surgical removal  - Plan:    Refer to Dr. Corey for surgical excision of the facial cyst    Informed patient of potential risks:     - Scarring     - Possible keloid formation    Advised to schedule surgery avoiding vacation or swimming periods    Instructed to avoid submerging wound underwater for 2 weeks post-surgery    Recommended cessation of smoking for at least 2 weeks post-surgery to promote healing    Advised to contact primary care physician for nicotine patches, ideally 2-3 weeks before surgery    Informed patient to return immediately if keloid formation occurs post-surgery for steroid injection  2. Acne - Assessment:  Patient has a history of acne with multiple smaller lesions noted on the cheek. Current treatment regimen is not mentioned, indicating a need for topical therapy to manage ongoing acne and prevent new lesions.  - Plan:    Prescribed tretinoin cream for nighttime use    Instructed on application:     - Start with twice weekly application (e.g., Monday and Thursday) for one month     - Increase to three times weekly after one month    Educated on tretinoin benefits:     - Increases skin cell turnover     - Reduces blackheads and whiteheads     - Promotes collagen production (anti-aging effect)    Consider lesion extraction if bumps persist at follow-up    No follow-ups on file.  I, Jetta Ager, am acting as Neurosurgeon for [employer], DO.  Documentation: I have reviewed the above documentation for accuracy and completeness, and I agree with the above.  Delon Lenis, DO

## 2024-05-13 NOTE — Patient Instructions (Addendum)
 Date: Tue May 13 2024  Hello Tracy Fischer,  Thank you for visiting today. Here is a summary of the key instructions:  - Tretinoin Cream Usage:   - Use at nighttime for acne   - Start with 2 nights a week (e.g., Monday and Thursday) for about a month   - Then increase to 3 nights a week  - Facial Cyst Excision:   - Schedule procedure with Dr. Corey   - Avoid swimming or going underwater for about 2 weeks after surgery  - Lifestyle Modifications:   - Stop smoking, ideally 2-3 weeks before surgery and for 2 weeks after   - Consider using nicotine patches instead of smoking (consult primary doctor)  - Follow-up: Return in 4 months to check progress with tretinoin cream  - Additional Instructions:   - Pick up tretinoin cream prescription from your pharmacy   - Make an appointment with Dr. Corey for cyst excision before leaving today   - If keloid scarring occurs after surgery, come in immediately for treatment    Please reach out if you have any questions or concerns.  Warm regards,  Dr. Delon Lenis, Dermatology      Important Information  Due to recent changes in healthcare laws, you may see results of your pathology and/or laboratory studies on MyChart before the doctors have had a chance to review them. We understand that in some cases there may be results that are confusing or concerning to you. Please understand that not all results are received at the same time and often the doctors may need to interpret multiple results in order to provide you with the best plan of care or course of treatment. Therefore, we ask that you please give us  2 business days to thoroughly review all your results before contacting the office for clarification. Should we see a critical lab result, you will be contacted sooner.   If You Need Anything After Your Visit  If you have any questions or concerns for your doctor, please call our main line at [phone] If no one answers, please leave a  voicemail as directed and we will return your call as soon as possible. Messages left after 4 pm will be answered the following business day.   You may also send us  a message via MyChart. We typically respond to MyChart messages within 1-2 business days.  For prescription refills, please ask your pharmacy to contact our office. Our fax number is [phone].  If you have an urgent issue when the clinic is closed that cannot wait until the next business day, you can page your doctor at the number below.    Please note that while we do our best to be available for urgent issues outside of office hours, we are not available 24/7.   If you have an urgent issue and are unable to reach us , you may choose to seek medical care at your doctor's office, retail clinic, urgent care center, or emergency room.  If you have a medical emergency, please immediately call 911 or go to the emergency department. In the event of inclement weather, please call our main line at [phone] for an update on the status of any delays or closures.  Dermatology Medication Tips: Please keep the boxes that topical medications come in in order to help keep track of the instructions about where and how to use these. Pharmacies typically print the medication instructions only on the boxes and not directly on the medication tubes.  If your medication is too expensive, please contact our office at [phone] or send us  a message through MyChart.   We are unable to tell what your co-pay for medications will be in advance as this is different depending on your insurance coverage. However, we may be able to find a substitute medication at lower cost or fill out paperwork to get insurance to cover a needed medication.   If a prior authorization is required to get your medication covered by your insurance company, please allow us  1-2 business days to complete this process.  Drug prices often vary depending on where the  prescription is filled and some pharmacies may offer cheaper prices.  The website www.goodrx.com contains coupons for medications through different pharmacies. The prices here do not account for what the cost may be with help from insurance (it may be cheaper with your insurance), but the website can give you the price if you did not use any insurance.  - You can print the associated coupon and take it with your prescription to the pharmacy.  - You may also stop by our office during regular business hours and pick up a GoodRx coupon card.  - If you need your prescription sent electronically to a different pharmacy, notify our office through [facility] or by phone at [phone]

## 2024-05-15 ENCOUNTER — Other Ambulatory Visit: Payer: Self-pay

## 2024-05-15 ENCOUNTER — Other Ambulatory Visit: Payer: Self-pay | Admitting: Family Medicine

## 2024-05-15 MED ORDER — AMLODIPINE BESYLATE 10 MG PO TABS
10.0000 mg | ORAL_TABLET | Freq: Every day | ORAL | 1 refills | Status: DC
  Filled 2024-05-15: qty 90, 90d supply, fill #0
  Filled 2024-06-25: qty 90, 90d supply, fill #1

## 2024-05-21 ENCOUNTER — Other Ambulatory Visit: Payer: Self-pay

## 2024-06-25 ENCOUNTER — Other Ambulatory Visit: Payer: Self-pay

## 2024-06-25 ENCOUNTER — Other Ambulatory Visit: Payer: Self-pay | Admitting: Family Medicine

## 2024-06-25 MED ORDER — CARVEDILOL 3.125 MG PO TABS
3.1250 mg | ORAL_TABLET | Freq: Two times a day (BID) | ORAL | 3 refills | Status: AC
  Filled 2024-06-25: qty 60, 30d supply, fill #0
  Filled 2024-07-22 – 2024-08-05 (×2): qty 60, 30d supply, fill #1
  Filled 2024-09-29: qty 60, 30d supply, fill #2
  Filled 2024-11-17: qty 60, 30d supply, fill #3

## 2024-06-26 ENCOUNTER — Other Ambulatory Visit: Payer: Self-pay

## 2024-07-15 ENCOUNTER — Encounter: Payer: Self-pay | Admitting: Dermatology

## 2024-07-22 ENCOUNTER — Encounter: Payer: Self-pay | Admitting: Dermatology

## 2024-07-22 ENCOUNTER — Ambulatory Visit: Admitting: Dermatology

## 2024-07-22 VITALS — BP 129/88 | HR 75 | Temp 97.4°F

## 2024-07-22 DIAGNOSIS — L720 Epidermal cyst: Secondary | ICD-10-CM

## 2024-07-22 DIAGNOSIS — D485 Neoplasm of uncertain behavior of skin: Secondary | ICD-10-CM

## 2024-07-22 DIAGNOSIS — D492 Neoplasm of unspecified behavior of bone, soft tissue, and skin: Secondary | ICD-10-CM | POA: Diagnosis not present

## 2024-07-22 NOTE — Progress Notes (Signed)
 Follow-Up Visit   Subjective  Tracy Fischer is a 58 y.o. female who presents for the following: Excision of right cheek for a subcutaneous nodule, referred by Dr. Alm.. It has been present for multiple years and has grown.   The following portions of the chart were reviewed this encounter and updated as appropriate: medications, allergies, medical history  Review of Systems:  No other skin or systemic complaints except as noted in HPI or Assessment and Plan.  Objective  Well appearing patient in no apparent distress; mood and affect are within normal limits.  A focused examination was performed of the following areas: Right cheek Relevant physical exam findings are noted in the Assessment and Plan.   Right Buccal Cheek Subcutaneous nodule   Assessment & Plan   NEOPLASM OF UNCERTAIN BEHAVIOR OF SKIN Right Buccal Cheek Skin excision  Excision method:  elliptical Lesion length (cm):  1.5 Lesion width (cm):  1.2 Margin per side (cm):  0.1 Total excision diameter (cm):  1.7 Informed consent: discussed and consent obtained   Timeout: patient name, date of birth, surgical site, and procedure verified   Procedure prep:  Patient was prepped and draped in usual sterile fashion Prep type:  Chlorhexidine  Anesthesia: the lesion was anesthetized in a standard fashion   Anesthetic:  1% lidocaine  w/ epinephrine  1-100,000 buffered w/ 8.4% NaHCO3 Instrument used: #15 blade   Hemostasis achieved with: electrodesiccation   Outcome: patient tolerated procedure well with no complications   Post-procedure details: sterile dressing applied and wound care instructions given   Dressing type: pressure dressing and petrolatum    Skin repair Complexity:  Complex Final length (cm):  3.4 Informed consent: discussed and consent obtained   Timeout: patient name, date of birth, surgical site, and procedure verified   Procedure prep:  Patient was prepped and draped in usual sterile  fashion Prep type:  Chlorhexidine  Anesthesia: the lesion was anesthetized in a standard fashion   Anesthetic:  1% lidocaine  w/ epinephrine  1-100,000 buffered w/ 8.4% NaHCO3 Reason for type of repair: preserve normal anatomy, preserve normal anatomical and functional relationships and avoid adjacent structures   Undermining: edges undermined   Subcutaneous layers (deep stitches):  Suture size:  5-0 Suture type: Monocryl (poliglecaprone 25)   Stitches:  Buried vertical mattress Fine/surface layer approximation (top stitches):  Suture size:  6-0 Suture type: fast-absorbing plain gut   Stitches: simple running   Hemostasis achieved with: suture, pressure and electrodesiccation Outcome: patient tolerated procedure well with no complications   Post-procedure details: sterile dressing applied and wound care instructions given   Dressing type: petrolatum and pressure dressing    Specimen 1 - Surgical pathology Differential Diagnosis: R/O cyst vs lipoma vs other  Check Margins: No  The surgical wound was then cleaned, prepped, and re-anesthetized as above. Wound edges were undermined extensively along at least one entire edge and at a distance equal to or greater than the width of the defect (see wound defect size above) in order to achieve closure and decrease wound tension and anatomic distortion. Redundant tissue repair including standing cone removal was performed. Hemostasis was achieved with electrocautery. Subcutaneous and epidermal tissues were approximated with the above sutures. The surgical site was then lightly scrubbed with sterile, saline-soaked gauze. The area was then bandaged using Vaseline ointment, non-adherent gauze, gauze pads, and tape to provide an adequate pressure dressing. The patient tolerated the procedure well, was given detailed written and verbal wound care instructions, and was discharged in good condition.  The patient will follow-up: PRN.  Return if symptoms  worsen or fail to improve.  LILLETTE Rollene Gobble, RN, am acting as scribe for RUFUS CHRISTELLA HOLY, MD .   Documentation: I have reviewed the above documentation for accuracy and completeness, and I agree with the above.  RUFUS CHRISTELLA HOLY, MD

## 2024-07-22 NOTE — Patient Instructions (Signed)

## 2024-07-24 LAB — SURGICAL PATHOLOGY

## 2024-07-25 ENCOUNTER — Ambulatory Visit: Payer: Self-pay | Admitting: Dermatology

## 2024-07-30 ENCOUNTER — Other Ambulatory Visit: Payer: Self-pay

## 2024-07-31 ENCOUNTER — Other Ambulatory Visit: Payer: Self-pay

## 2024-08-05 ENCOUNTER — Other Ambulatory Visit: Payer: Self-pay

## 2024-08-20 ENCOUNTER — Ambulatory Visit: Admitting: Family Medicine

## 2024-08-28 ENCOUNTER — Ambulatory Visit: Admitting: Family Medicine

## 2024-09-03 ENCOUNTER — Other Ambulatory Visit: Payer: Self-pay

## 2024-09-03 ENCOUNTER — Encounter: Payer: Self-pay | Admitting: Family Medicine

## 2024-09-03 ENCOUNTER — Ambulatory Visit: Admitting: Family Medicine

## 2024-09-03 VITALS — BP 112/70 | HR 68 | Temp 98.0°F | Ht 65.25 in | Wt 132.3 lb

## 2024-09-03 DIAGNOSIS — Z23 Encounter for immunization: Secondary | ICD-10-CM | POA: Diagnosis not present

## 2024-09-03 DIAGNOSIS — I10 Essential (primary) hypertension: Secondary | ICD-10-CM | POA: Diagnosis not present

## 2024-09-03 DIAGNOSIS — E782 Mixed hyperlipidemia: Secondary | ICD-10-CM

## 2024-09-03 LAB — COMPREHENSIVE METABOLIC PANEL WITH GFR
ALT: 22 U/L (ref 0–35)
AST: 26 U/L (ref 0–37)
Albumin: 4.5 g/dL (ref 3.5–5.2)
Alkaline Phosphatase: 65 U/L (ref 39–117)
BUN: 10 mg/dL (ref 6–23)
CO2: 27 meq/L (ref 19–32)
Calcium: 9.6 mg/dL (ref 8.4–10.5)
Chloride: 102 meq/L (ref 96–112)
Creatinine, Ser: 0.83 mg/dL (ref 0.40–1.20)
GFR: 77.71 mL/min (ref >60.00)
Glucose, Bld: 103 mg/dL — ABNORMAL HIGH (ref 70–99)
Potassium: 3.7 meq/L (ref 3.5–5.1)
Sodium: 137 meq/L (ref 135–145)
Total Bilirubin: 0.8 mg/dL (ref 0.2–1.2)
Total Protein: 7.2 g/dL (ref 6.0–8.3)

## 2024-09-03 LAB — LIPID PANEL
Cholesterol: 182 mg/dL (ref 0–200)
HDL: 69.3 mg/dL (ref >39.00)
LDL Cholesterol: 79 mg/dL (ref 0–99)
NonHDL: 113.14
Total CHOL/HDL Ratio: 3
Triglycerides: 169 mg/dL — ABNORMAL HIGH (ref 0.0–149.0)
VLDL: 33.8 mg/dL (ref 0.0–40.0)

## 2024-09-03 MED ORDER — SIMVASTATIN 20 MG PO TABS
20.0000 mg | ORAL_TABLET | Freq: Every evening | ORAL | 1 refills | Status: AC
  Filled 2024-09-03 – 2024-09-16 (×2): qty 90, 90d supply, fill #0

## 2024-09-03 MED ORDER — AMLODIPINE BESYLATE 10 MG PO TABS
10.0000 mg | ORAL_TABLET | Freq: Every day | ORAL | 1 refills | Status: AC
  Filled 2024-09-03 – 2024-09-16 (×2): qty 90, 90d supply, fill #0

## 2024-09-03 NOTE — Progress Notes (Signed)
 Established Patient Office Visit  Subjective   Patient ID: Tracy Fischer, female    DOB: 12-06-1965  Age: 58 y.o. MRN: [id number]  Chief Complaint  Patient presents with   Medical Management of Chronic Issues    HPI Discussed the use of AI scribe software for clinical note transcription with the patient, who gave verbal consent to proceed.  History of Present Illness   Tracy Fischer is a 58 year old female with hypertension and hyperlipidemia who presents for a six-month follow-up visit.  She is currently taking amlodipine  and carvedilol  for hypertension and medication for hyperlipidemia. She occasionally uses mirtazapine  for anxiety and sleep issues. She experiences eye twitching, described as her eye 'jumping sometimes,' without vision problems. She attributes this to increased screen time. She has smoked since 1984, currently about half a pack every two to two and a half days. She plans to consult her dermatologist about a bump on her leg next week. She is hesitant about receiving a flu shot due to past experiences of feeling unwell after vaccination.      Current Outpatient Medications  Medication Instructions   acetaminophen  (TYLENOL ) 1,000 mg, Oral, Every 8 hours PRN   amLODipine  (NORVASC ) 10 mg, Oral, Daily   carvedilol  (COREG ) 3.125 mg, Oral, 2 times daily   chlorhexidine  (PERIDEX ) 0.12 % solution Rinse with15 mLs for 30 secs two times daily(morning and evening).   cycloSPORINE  (RESTASIS ) 0.05 % ophthalmic emulsion 1 drop, Both Eyes, 2 times daily   fluticasone  (FLONASE  ALLERGY RELIEF) 50 MCG/ACT nasal spray 1 spray, Each Nare, Daily   Loteprednol  Etabonate (EYSUVIS ) 0.25 % SUSP Place 1 drop into both eyes 4 (four) times daily for up to 2 weeks.   mirtazapine  (REMERON ) 7.5 mg, Oral, Daily at bedtime   Multiple Vitamin (MULTI VITAMIN PO) Take by mouth.   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     Objective:     BP 112/70   Pulse 68   Temp 98 F (36.7 C) (Oral)   Ht 5' 5.25 (1.657 m)   Wt 132 lb 4.8 oz (60 kg)   LMP 04/21/2015 (Approximate) Comment: Perimenopausal  SpO2 98%   BMI 21.85 kg/m    Physical Exam Vitals reviewed.  Constitutional:      Appearance: Normal appearance. She is well-groomed and normal weight.  Cardiovascular:     Rate and Rhythm: Normal rate and regular rhythm.     Heart sounds: S1 normal and S2 normal.  Pulmonary:     Effort: Pulmonary effort is normal.     Breath sounds: Normal breath sounds and air entry.  Musculoskeletal:     Right lower leg: No edema.     Left lower leg: No edema.  Neurological:  Mental Status: She is alert and oriented to person, place, and time. Mental status is at baseline.     Gait: Gait is intact.  Psychiatric:        Mood and Affect: Mood and affect normal.        Speech: Speech normal.        Behavior: Behavior normal.        Judgment: Judgment normal.      No results found for any visits on 09/03/24.    The 10-year ASCVD risk score (Arnett DK, et al., 2019) is: 7.8%    Assessment & Plan:  Primary hypertension -     amLODIPine  Besylate; Take 1 tablet (10 mg total) by mouth daily.  Dispense: 90 tablet; Refill: 1 -     Comprehensive metabolic  panel with GFR; Future  Mixed hyperlipidemia -     Simvastatin ; Take 1 tablet (20 mg total) by mouth every evening.  Dispense: 90 tablet; Refill: 1 -     Lipid panel; Future  Immunization due -     Flu vaccine trivalent PF, 6mos and older(Flulaval,Afluria,Fluarix,Fluzone) -     Varicella-zoster vaccine IM   Assessment and Plan    Tobacco use disorder Long-term tobacco use since 1984, currently smoking about half a pack every two to two and a half days. - Discussed lung cancer screening eligibility and benefits. She opted to wait one more year before proceeding with CT scan for lung cancer screening.  Hypertension Hypertension is well-controlled with a current blood pressure reading of 112/70 mmHg. - Refill amlodipine  prescription. - Continue carvedilol  as it still has refills.  Hyperlipidemia - Order comprehensive metabolic panel to check cholesterol levels.  Insomnia and anxiety Intermittent use of mirtazapine  for insomnia and anxiety. No new concerns reported. - Continue mirtazapine  as needed for insomnia and anxiety.  Eye strain and eyelid myokymia Intermittent eyelid twitching likely due to eye strain, fatigue, or stress. Last eye exam was last year. - Advise reducing screen time and taking breaks to alleviate eye strain. - Recommend relaxing eye muscles and massaging eyelids to reduce twitching.  General Health Maintenance Routine health maintenance is up to date with Pap smear and colonoscopy. Discussed vaccinations and their importance, especially given smoking history. - Administer first dose of shingles vaccine today; second dose in six months.        Return in about 6 months (around 03/04/2025) for annual physical exam.    Heron CHRISTELLA Sharper, MD

## 2024-09-09 ENCOUNTER — Ambulatory Visit: Payer: Self-pay | Admitting: Family Medicine

## 2024-09-12 ENCOUNTER — Other Ambulatory Visit: Payer: Self-pay

## 2024-09-15 ENCOUNTER — Ambulatory Visit: Payer: Self-pay

## 2024-09-15 ENCOUNTER — Ambulatory Visit (INDEPENDENT_AMBULATORY_CARE_PROVIDER_SITE_OTHER): Admitting: Family Medicine

## 2024-09-15 ENCOUNTER — Other Ambulatory Visit: Payer: Self-pay

## 2024-09-15 ENCOUNTER — Encounter: Payer: Self-pay | Admitting: Family Medicine

## 2024-09-15 VITALS — BP 140/96 | HR 70 | Temp 98.2°F | Ht 65.25 in | Wt 136.3 lb

## 2024-09-15 DIAGNOSIS — M5441 Lumbago with sciatica, right side: Secondary | ICD-10-CM

## 2024-09-15 LAB — POCT URINALYSIS DIPSTICK
Bilirubin, UA: NEGATIVE
Blood, UA: NEGATIVE
Glucose, UA: NEGATIVE
Ketones, UA: NEGATIVE
Leukocytes, UA: NEGATIVE
Nitrite, UA: NEGATIVE
Protein, UA: NEGATIVE
Spec Grav, UA: 1.01 (ref 1.010–1.025)
Urobilinogen, UA: 0.2 U/dL
pH, UA: 6.5 (ref 5.0–8.0)

## 2024-09-15 MED ORDER — CYCLOBENZAPRINE HCL 10 MG PO TABS
10.0000 mg | ORAL_TABLET | Freq: Three times a day (TID) | ORAL | 0 refills | Status: AC | PRN
  Filled 2024-09-15: qty 30, 10d supply, fill #0

## 2024-09-15 MED ORDER — KETOROLAC TROMETHAMINE 60 MG/2ML IM SOLN
60.0000 mg | Freq: Once | INTRAMUSCULAR | Status: AC
  Administered 2024-09-15: 60 mg via INTRAMUSCULAR

## 2024-09-15 NOTE — Telephone Encounter (Signed)
Patient currently in the office for a visit.

## 2024-09-15 NOTE — Telephone Encounter (Signed)
 FYI Only or Action Required?: Action required by provider: request for appointment.  Patient was last seen in primary care on 09/03/2024 by Ozell Heron HERO, MD.  Called Nurse Triage reporting No chief complaint on file..  Symptoms began several days ago.  Interventions attempted: Rest, hydration, or home remedies.  Symptoms are: gradually worsening.  Triage Disposition: See PCP When Office is Open (Within 3 Days)  Patient/caregiver understands and will follow disposition?: Yes   Copied from CRM #[id number]. Topic: Clinical - Red Word Triage >> Sep 15, 2024 10:01 AM Dedra NOVAK wrote: Red Word that prompted transfer to Nurse Triage: Pt is experiencing lower back pain. It hurts to sit, stand, and walk. Warm transfer to NT Reason for Disposition  [1] MODERATE back pain (e.g., interferes with normal activities) AND [2] present > 3 days  Answer Assessment - Initial Assessment Questions 1. ONSET: When did the pain begin? (e.g., minutes, hours, days)     Wednesday  2. LOCATION: Where does it hurt? (upper, mid or lower back)     Lower Back Pain, Dimple Area  3. SEVERITY: How bad is the pain?  (e.g., Scale 1-10; mild, moderate, or severe)     Mild to Moderate  4. PATTERN: Is the pain constant? (e.g., yes, no; constant, intermittent)      Intermittent  5. RADIATION: Does the pain shoot into your legs or somewhere else?     Front, Legs  6. CAUSE:  What do you think is causing the back pain?      Unsure  7. BACK OVERUSE:  Any recent lifting of heavy objects, strenuous work or exercise?     Lifting heavy bags of groceries  8. MEDICINES: What have you taken so far for the pain? (e.g., nothing, acetaminophen , NSAIDS)     Tylenol , Ibuprofen   9. NEUROLOGIC SYMPTOMS: Do you have any weakness, numbness, or problems with bowel/bladder control?     No  10. OTHER SYMPTOMS: Do you have any other symptoms? (e.g., fever, abdomen pain, burning with urination, blood in  urine)       Abdominal pain  11. PREGNANCY: Is there any chance you are pregnant? When was your last menstrual period?       No and No  Protocols used: Back Pain-A-AH

## 2024-09-15 NOTE — Progress Notes (Unsigned)
 Established Patient Office Visit  Subjective   Patient ID: Tracy Fischer, female    DOB: [birth year]  Age: 58 y.o. MRN: [id number]  Chief Complaint  Patient presents with   Back Pain    Patient complains of sudden onset of low back pain x5 days, radiates down posterior thighs, tried heat and Salonpas with some relief, no known injury    Back Pain   History of Present Illness The patient presents with lower back pain radiating to the front.  She has experienced sudden onset lower back pain for five days, primarily on the left side, radiating to the center and around to the front near the hip bones. The pain occasionally spasms during activities such as walking, noted during a recent grocery store visit. She denies urinary symptoms and reports normal bowel movements, with the last one this morning. She has chills but no fever or nausea. Tylenol , heat, and Salonpas patches provide some pain relief. She recalls possibly straining her back while reaching over in bed after carrying grocery bags. There is no pain radiating down her legs except occasionally on the right side. She is taking Tylenol  and staying hydrated with water.  Assessment and Plan Acute lower back pain Acute lower back pain for five days, primarily on the left side, radiating to the front and occasionally down the right leg. No dysuria, fever, or nausea, but reports chills. Pain does not worsen with palpation. Possible muscle strain from recent activity, differential includes kidney stone, urinary tract infection, or slipped disc. Tylenol  provides some relief, but pain persists and occasionally spasms. - Check urine for infection or blood to rule out kidney stone or urinary tract infection. - If urine is clear, order x-ray of the lower back to assess for compression fracture or other issues. - Prescribe muscle relaxer if urine is clear to help with symptoms. - Switch from Tylenol  to ibuprofen  or naproxen for better  anti-inflammatory effect. - Administer Toradol  injection in the office for immediate pain relief.  Current Outpatient Medications  Medication Instructions   acetaminophen  (TYLENOL ) 1,000 mg, Oral, Every 8 hours PRN   amLODipine  (NORVASC ) 10 mg, Oral, Daily   carvedilol  (COREG ) 3.125 mg, Oral, 2 times daily   chlorhexidine  (PERIDEX ) 0.12 % solution Rinse with15 mLs for 30 secs two times daily(morning and evening).   cyclobenzaprine (FLEXERIL) 10 mg, Oral, 3 times daily PRN   cycloSPORINE  (RESTASIS ) 0.05 % ophthalmic emulsion 1 drop, Both Eyes, 2 times daily   fluticasone  (FLONASE  ALLERGY RELIEF) 50 MCG/ACT nasal spray 1 spray, Each Nare, Daily   Loteprednol  Etabonate (EYSUVIS ) 0.25 % SUSP Place 1 drop into both eyes 4 (four) times daily for up to 2 weeks.   mirtazapine  (REMERON ) 7.5 mg, Oral, Daily at bedtime   Multiple Vitamin (MULTI VITAMIN PO) Take by mouth.   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  All other systems reviewed and are negative.     Objective:     BP (!) 140/96   Pulse 70   Temp 98.2 F (36.8 C) (Oral)   Ht 5' 5.25 (1.657 m)   Wt 136 lb 4.8 oz (61.8 kg)   LMP 04/21/2015 (Approximate) Comment: Perimenopausal  SpO2 98%   BMI 22.51 kg/m   {Vitals History (Optional):23777}  Physical Exam   Results for orders placed or performed in visit on 09/15/24  POC Urinalysis Dipstick  Result Value Ref Range   Color, UA yellow    Clarity, UA clear    Glucose, UA Negative Negative   Bilirubin, UA negative    Ketones, UA negative    Spec Grav, UA 1.010 1.010 - 1.025   Blood, UA negative    pH, UA 6.5 5.0 - 8.0   Protein, UA Negative Negative   Urobilinogen, UA 0.2 0.2 or 1.0 E.U./dL   Nitrite, UA negative    Leukocytes, UA Negative Negative   Appearance     Odor      {Labs (Optional):23779}  The 10-year ASCVD risk score (Arnett DK, et al., 2019) is: 11.9%    Assessment & Plan:  Acute midline low back pain with right-sided sciatica -     POCT urinalysis dipstick -     DG Lumbar Spine Complete; Future -     Cyclobenzaprine HCl; Take 1 tablet (10 mg total) by mouth 3 (three) times daily as needed for muscle spasms.  Dispense: 30 tablet; Refill: 0 -     Ketorolac  Tromethamine      No follow-ups on file.    Heron CHRISTELLA Sharper, MD

## 2024-09-16 ENCOUNTER — Other Ambulatory Visit: Payer: Self-pay

## 2024-09-16 ENCOUNTER — Ambulatory Visit
Admission: RE | Admit: 2024-09-16 | Discharge: 2024-09-16 | Disposition: A | Source: Ambulatory Visit | Attending: Family Medicine

## 2024-09-16 DIAGNOSIS — M5441 Lumbago with sciatica, right side: Secondary | ICD-10-CM

## 2024-09-16 DIAGNOSIS — M47816 Spondylosis without myelopathy or radiculopathy, lumbar region: Secondary | ICD-10-CM | POA: Diagnosis not present

## 2024-09-17 ENCOUNTER — Other Ambulatory Visit: Payer: Self-pay

## 2024-09-18 ENCOUNTER — Ambulatory Visit: Payer: Self-pay | Admitting: Family Medicine

## 2024-09-30 ENCOUNTER — Ambulatory Visit: Admitting: Dermatology

## 2024-11-17 ENCOUNTER — Other Ambulatory Visit: Payer: Self-pay

## 2025-01-30 ENCOUNTER — Encounter

## 2025-01-30 ENCOUNTER — Other Ambulatory Visit

## 2025-03-04 ENCOUNTER — Encounter: Admitting: Family Medicine

## 2025-03-24 ENCOUNTER — Ambulatory Visit: Admitting: Dermatology
